# Patient Record
Sex: Male | Born: 1940 | Race: White | Hispanic: No | Smoking: Former smoker
Health system: Southern US, Community
[De-identification: ages and names within clinical notes are randomized; demographics above are authoritative.]

## PROBLEM LIST (undated history)

## (undated) DIAGNOSIS — L089 Local infection of the skin and subcutaneous tissue, unspecified: Secondary | ICD-10-CM

## (undated) DIAGNOSIS — J45909 Unspecified asthma, uncomplicated: Secondary | ICD-10-CM

## (undated) DIAGNOSIS — R0602 Shortness of breath: Secondary | ICD-10-CM

## (undated) DIAGNOSIS — I499 Cardiac arrhythmia, unspecified: Secondary | ICD-10-CM

## (undated) DIAGNOSIS — R609 Edema, unspecified: Secondary | ICD-10-CM

## (undated) DIAGNOSIS — M25562 Pain in left knee: Secondary | ICD-10-CM

## (undated) DIAGNOSIS — I6529 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: Secondary | ICD-10-CM

## (undated) DIAGNOSIS — I493 Ventricular premature depolarization: Secondary | ICD-10-CM

## (undated) DIAGNOSIS — R351 Nocturia: Secondary | ICD-10-CM

## (undated) DIAGNOSIS — K52831 Collagenous colitis: Secondary | ICD-10-CM

## (undated) DIAGNOSIS — R21 Rash and other nonspecific skin eruption: Secondary | ICD-10-CM

## (undated) DIAGNOSIS — K760 Fatty (change of) liver, not elsewhere classified: Secondary | ICD-10-CM

## (undated) DIAGNOSIS — F329 Major depressive disorder, single episode, unspecified: Secondary | ICD-10-CM

## (undated) DIAGNOSIS — N529 Male erectile dysfunction, unspecified: Secondary | ICD-10-CM

## (undated) DIAGNOSIS — R059 Cough, unspecified: Secondary | ICD-10-CM

## (undated) DIAGNOSIS — R0609 Other forms of dyspnea: Secondary | ICD-10-CM

## (undated) DIAGNOSIS — R197 Diarrhea, unspecified: Secondary | ICD-10-CM

## (undated) DIAGNOSIS — T7840XA Allergy, unspecified, initial encounter: Secondary | ICD-10-CM

## (undated) DIAGNOSIS — F419 Anxiety disorder, unspecified: Secondary | ICD-10-CM

## (undated) DIAGNOSIS — H9319 Tinnitus, unspecified ear: Secondary | ICD-10-CM

## (undated) DIAGNOSIS — R109 Unspecified abdominal pain: Secondary | ICD-10-CM

## (undated) DIAGNOSIS — M25571 Pain in right ankle and joints of right foot: Secondary | ICD-10-CM

## (undated) DIAGNOSIS — R7989 Other specified abnormal findings of blood chemistry: Secondary | ICD-10-CM

## (undated) DIAGNOSIS — G4700 Insomnia, unspecified: Secondary | ICD-10-CM

## (undated) DIAGNOSIS — B029 Zoster without complications: Secondary | ICD-10-CM

## (undated) DIAGNOSIS — H269 Unspecified cataract: Secondary | ICD-10-CM

## (undated) DIAGNOSIS — R7301 Impaired fasting glucose: Secondary | ICD-10-CM

## (undated) DIAGNOSIS — S6290XS Unspecified fracture of unspecified wrist and hand, sequela: Secondary | ICD-10-CM

## (undated) DIAGNOSIS — M7541 Impingement syndrome of right shoulder: Secondary | ICD-10-CM

## (undated) DIAGNOSIS — E669 Obesity, unspecified: Secondary | ICD-10-CM

## (undated) DIAGNOSIS — K219 Gastro-esophageal reflux disease without esophagitis: Secondary | ICD-10-CM

## (undated) DIAGNOSIS — E279 Disorder of adrenal gland, unspecified: Secondary | ICD-10-CM

## (undated) DIAGNOSIS — M1990 Unspecified osteoarthritis, unspecified site: Secondary | ICD-10-CM

## (undated) DIAGNOSIS — M109 Gout, unspecified: Secondary | ICD-10-CM

## (undated) DIAGNOSIS — F32A Depression, unspecified: Secondary | ICD-10-CM

## (undated) DIAGNOSIS — R0600 Dyspnea, unspecified: Secondary | ICD-10-CM

## (undated) DIAGNOSIS — E875 Hyperkalemia: Secondary | ICD-10-CM

## (undated) DIAGNOSIS — E7800 Pure hypercholesterolemia, unspecified: Secondary | ICD-10-CM

## (undated) DIAGNOSIS — I700 Atherosclerosis of aorta: Secondary | ICD-10-CM

## (undated) DIAGNOSIS — I10 Essential (primary) hypertension: Secondary | ICD-10-CM

## (undated) DIAGNOSIS — I739 Peripheral vascular disease, unspecified: Secondary | ICD-10-CM

## (undated) DIAGNOSIS — M8580 Other specified disorders of bone density and structure, unspecified site: Secondary | ICD-10-CM

## (undated) DIAGNOSIS — K5900 Constipation, unspecified: Secondary | ICD-10-CM

## (undated) DIAGNOSIS — M25462 Effusion, left knee: Secondary | ICD-10-CM

## (undated) HISTORY — DX: Zoster without complications: B02.9

## (undated) HISTORY — DX: Ventricular premature depolarization: I49.3

## (undated) HISTORY — DX: Edema, unspecified: R60.9

## (undated) HISTORY — PX: COLONOSCOPY: SHX174

## (undated) HISTORY — DX: Allergy, unspecified, initial encounter: T78.40XA

## (undated) HISTORY — DX: Pain in left knee: M25.562

## (undated) HISTORY — DX: Unspecified cataract: H26.9

## (undated) HISTORY — PX: CATARACT EXTRACTION: SUR2

## (undated) HISTORY — DX: Other forms of dyspnea: R06.09

## (undated) HISTORY — DX: Nocturia: R35.1

## (undated) HISTORY — DX: Collagenous colitis: K52.831

## (undated) HISTORY — DX: Shortness of breath: R06.02

## (undated) HISTORY — DX: Impaired fasting glucose: R73.01

## (undated) HISTORY — DX: Obesity, unspecified: E66.9

## (undated) HISTORY — PX: ROTATOR CUFF REPAIR: SHX139

## (undated) HISTORY — DX: Constipation, unspecified: K59.00

## (undated) HISTORY — DX: Essential (primary) hypertension: I10

## (undated) HISTORY — DX: Dyspnea, unspecified: R06.00

## (undated) HISTORY — DX: Local infection of the skin and subcutaneous tissue, unspecified: L08.9

## (undated) HISTORY — DX: Depression, unspecified: F32.A

## (undated) HISTORY — DX: Pain in right ankle and joints of right foot: M25.571

## (undated) HISTORY — DX: Unspecified asthma, uncomplicated: J45.909

## (undated) HISTORY — PX: OTHER SURGICAL HISTORY: SHX169

## (undated) HISTORY — DX: Cardiac arrhythmia, unspecified: I49.9

## (undated) HISTORY — DX: Tinnitus, unspecified ear: H93.19

## (undated) HISTORY — DX: Atherosclerosis of aorta: I70.0

## (undated) HISTORY — DX: Hyperkalemia: E87.5

## (undated) HISTORY — DX: Diarrhea, unspecified: R19.7

## (undated) HISTORY — DX: Unspecified abdominal pain: R10.9

## (undated) HISTORY — DX: 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: I65.29

## (undated) HISTORY — DX: Pure hypercholesterolemia, unspecified: E78.00

## (undated) HISTORY — DX: Major depressive disorder, single episode, unspecified: F32.9

## (undated) HISTORY — DX: Male erectile dysfunction, unspecified: N52.9

## (undated) HISTORY — DX: Effusion, left knee: M25.462

## (undated) HISTORY — DX: Fatty (change of) liver, not elsewhere classified: K76.0

## (undated) HISTORY — DX: Unspecified fracture of unspecified wrist and hand, sequela: S62.90XS

## (undated) HISTORY — DX: Anxiety disorder, unspecified: F41.9

## (undated) HISTORY — DX: Insomnia, unspecified: G47.00

## (undated) HISTORY — DX: Other specified abnormal findings of blood chemistry: R79.89

## (undated) HISTORY — DX: Disorder of adrenal gland, unspecified: E27.9

## (undated) HISTORY — PX: NASAL SINUS SURGERY: SHX719

## (undated) HISTORY — PX: LUMBAR LAMINECTOMY: SHX95

## (undated) HISTORY — DX: Gastro-esophageal reflux disease without esophagitis: K21.9

## (undated) HISTORY — DX: Peripheral vascular disease, unspecified: I73.9

## (undated) HISTORY — DX: Other specified disorders of bone density and structure, unspecified site: M85.80

## (undated) HISTORY — DX: Cough, unspecified: R05.9

## (undated) HISTORY — DX: Impingement syndrome of right shoulder: M75.41

## (undated) HISTORY — DX: Unspecified osteoarthritis, unspecified site: M19.90

## (undated) HISTORY — DX: Rash and other nonspecific skin eruption: R21

## (undated) HISTORY — DX: Gout, unspecified: M10.9

---

## 1971-08-28 HISTORY — PX: BACK SURGERY: SHX140

## 1998-02-13 ENCOUNTER — Observation Stay (HOSPITAL_COMMUNITY): Admission: EM | Admit: 1998-02-13 | Discharge: 1998-02-14 | Payer: Self-pay | Admitting: *Deleted

## 2001-08-25 ENCOUNTER — Encounter: Payer: Self-pay | Admitting: Orthopedic Surgery

## 2001-08-25 ENCOUNTER — Ambulatory Visit (HOSPITAL_COMMUNITY): Admission: RE | Admit: 2001-08-25 | Discharge: 2001-08-25 | Payer: Self-pay | Admitting: Orthopedic Surgery

## 2002-09-01 ENCOUNTER — Encounter: Payer: Self-pay | Admitting: Orthopedic Surgery

## 2002-09-08 ENCOUNTER — Observation Stay (HOSPITAL_COMMUNITY): Admission: RE | Admit: 2002-09-08 | Discharge: 2002-09-09 | Payer: Self-pay | Admitting: Orthopedic Surgery

## 2002-12-15 ENCOUNTER — Encounter: Payer: Self-pay | Admitting: Orthopedic Surgery

## 2002-12-15 ENCOUNTER — Encounter: Admission: RE | Admit: 2002-12-15 | Discharge: 2002-12-15 | Payer: Self-pay | Admitting: Orthopedic Surgery

## 2005-06-29 ENCOUNTER — Ambulatory Visit: Payer: Self-pay | Admitting: Gastroenterology

## 2005-07-06 ENCOUNTER — Encounter (INDEPENDENT_AMBULATORY_CARE_PROVIDER_SITE_OTHER): Payer: Self-pay | Admitting: *Deleted

## 2005-07-06 ENCOUNTER — Ambulatory Visit: Payer: Self-pay | Admitting: Gastroenterology

## 2010-05-11 ENCOUNTER — Encounter (INDEPENDENT_AMBULATORY_CARE_PROVIDER_SITE_OTHER): Payer: Self-pay | Admitting: *Deleted

## 2010-07-11 ENCOUNTER — Encounter (INDEPENDENT_AMBULATORY_CARE_PROVIDER_SITE_OTHER): Payer: Self-pay | Admitting: *Deleted

## 2010-07-25 ENCOUNTER — Encounter (INDEPENDENT_AMBULATORY_CARE_PROVIDER_SITE_OTHER): Payer: Self-pay | Admitting: *Deleted

## 2010-07-26 ENCOUNTER — Ambulatory Visit: Payer: Self-pay | Admitting: Gastroenterology

## 2010-08-09 ENCOUNTER — Ambulatory Visit: Payer: Self-pay | Admitting: Gastroenterology

## 2010-08-10 ENCOUNTER — Encounter: Payer: Self-pay | Admitting: Gastroenterology

## 2010-09-26 NOTE — Letter (Signed)
Summary: Colonoscopy Letter  [facility]  [address] [place], [place] [address]   Phone: [phone]  Fax: [phone]      May 11, 2010 MRN: [id number]   Lee Compton [address] [place], [place]  [address]   Dear [name],   According to your medical record, it is time for you to schedule a Colonoscopy. The American Cancer Society recommends this procedure as a method to detect early colon cancer. Patients with a family history of colon cancer, or a personal history of colon polyps or inflammatory bowel disease are at increased risk.  This letter has beeen generated based on the recommendations made at the time of your procedure. If you feel that in your particular situation this may no longer apply, please contact our office.  Please call our office at [phone] to schedule this appointment or to update your records at your earliest convenience.  Thank you for cooperating with [place] to provide you with the very best care possible.   Sincerely,  Judie Petit T. Russella Dar, M.D.  [facility] Gastroenterology Division [phone]

## 2010-09-26 NOTE — Miscellaneous (Signed)
Summary: [facility] PV  Clinical Lists Changes  Medications: Added new medication of MOVIPREP 100 GM  SOLR (PEG-KCL-NACL-NASULF-NA ASC-C) As per prep instructions. - Signed Rx of MOVIPREP 100 GM  SOLR (PEG-KCL-NACL-NASULF-NA ASC-C) As per prep instructions.;  #1 x 0;  Signed;  Entered by: Ezra Sites RN;  Authorized by: Meryl Dare MD [facility];  Method used: Electronically to [facility]  [phone]*, [address], [place], [place], [place]  [address], Ph: [phone] or [phone], Fax: [phone] Observations: Added new observation of NKA: T (07/26/2010 10:07)    Prescriptions: MOVIPREP 100 GM  SOLR (PEG-KCL-NACL-NASULF-NA ASC-C) As per prep instructions.  #1 x 0   Entered by:   Ezra Sites RN   Authorized by:   Meryl Dare MD [facility]   Signed by:   Ezra Sites RN on 07/26/2010   Method used:   Electronically to        [facility]  [id number]* (retail)       [address]       [place], [place]  [address]       Ph: [phone] or [phone]       Fax: [phone]   RxID:   [id number]

## 2010-09-26 NOTE — Letter (Signed)
Summary: [facility] Instructions  [facility]  [address] [place], [place] [address]   Phone: [phone]  Fax: [phone]       Lee Compton    Nov 07, 1940    MRN: [id number]        Procedure Day Dorna Bloom:  Wednesday 08/09/2010     Arrival Time:  10:00 am      Procedure Time:  11:00 am     Location of Procedure:                    _x _  [facility] (4th Floor)                        PREPARATION FOR COLONOSCOPY WITH MOVIPREP   Starting 5 days prior to your procedure Friday 12/9 do not eat nuts, seeds, popcorn, corn, beans, peas,  salads, or any raw vegetables.  Do not take any fiber supplements (e.g. Metamucil, Citrucel, and Benefiber).  THE DAY BEFORE YOUR PROCEDURE         DATE: Tuesday 12/13  1.  Drink clear liquids the entire day-NO SOLID FOOD  2.  Do not drink anything colored red or purple.  Avoid juices with pulp.  No orange juice.  3.  Drink at least 64 oz. (8 glasses) of fluid/clear liquids during the day to prevent dehydration and help the prep work efficiently.  CLEAR LIQUIDS INCLUDE: Water Jello Ice Popsicles Tea (sugar ok, no milk/cream) Powdered fruit flavored drinks Coffee (sugar ok, no milk/cream) Gatorade Juice: apple, white grape, white cranberry  Lemonade Clear bullion, consomm, broth Carbonated beverages (any kind) Strained chicken noodle soup Hard Candy                             4.  In the morning, mix first dose of MoviPrep solution:    Empty 1 Pouch A and 1 Pouch B into the disposable container    Add lukewarm drinking water to the top line of the container. Mix to dissolve    Refrigerate (mixed solution should be used within 24 hrs)  5.  Begin drinking the prep at 5:00 p.m. The MoviPrep container is divided by 4 marks.   Every 15 minutes drink the solution down to the next mark (approximately 8 oz) until the full liter is complete.   6.  Follow completed prep with 16 oz of clear liquid of your choice  (Nothing red or purple).  Continue to drink clear liquids until bedtime.  7.  Before going to bed, mix second dose of MoviPrep solution:    Empty 1 Pouch A and 1 Pouch B into the disposable container    Add lukewarm drinking water to the top line of the container. Mix to dissolve    Refrigerate  THE DAY OF YOUR PROCEDURE      DATE: Wednesday 12/14  Beginning at 6:00 a.m. (5 hours before procedure):         1. Every 15 minutes, drink the solution down to the next mark (approx 8 oz) until the full liter is complete.  2. Follow completed prep with 16 oz. of clear liquid of your choice.    3. You may drink clear liquids until 9:00 am (2 HOURS BEFORE PROCEDURE).   MEDICATION INSTRUCTIONS  Unless otherwise instructed, you should take regular prescription medications with a small sip of water   as early as possible the  morning of your procedure.  Additional medication instructions: Do not take Benazepril/HCTZ day of procedure.         OTHER INSTRUCTIONS  You will need a responsible adult at least 68 years of age to accompany you and drive you home.   This person must remain in the waiting room during your procedure.  Wear loose fitting clothing that is easily removed.  Leave jewelry and other valuables at home.  However, you may wish to bring a book to read or  an iPod/MP3 player to listen to music as you wait for your procedure to start.  Remove all body piercing jewelry and leave at home.  Total time from sign-in until discharge is approximately 2-3 hours.  You should go home directly after your procedure and rest.  You can resume normal activities the  day after your procedure.  The day of your procedure you should not:   Drive   Make legal decisions   Operate machinery   Drink alcohol   Return to work  You will receive specific instructions about eating, activities and medications before you leave.    The above instructions have been reviewed and  explained to me by   Ezra Sites RN  July 26, 2010 10:25 AM     I fully understand and can verbalize these instructions _____________________________ Date _________

## 2010-09-26 NOTE — Letter (Signed)
Summary: Pre Visit Letter Revised  [facility]  [address] [place], [place] [address]   Phone: [phone]  Fax: [phone]        07/11/2010 MRN: [id number] Lee Compton [address] [place], [place]  [address]             Procedure Date:  08-09-10   Welcome to the Gastroenterology Division at [facility].    You are scheduled to see a nurse for your pre-procedure visit on 07-26-10 at 10:00a.m. on the 3rd floor at [facility], [address]  We ask that you try to arrive at our office 15 minutes prior to your appointment time to allow for check-in.  Please take a minute to review the attached form.  If you answer "Yes" to one or more of the questions on the first page, we ask that you call the person listed at your earliest opportunity.  If you answer "No" to all of the questions, please complete the rest of the form and bring it to your appointment.    Your nurse visit will consist of discussing your medical and surgical history, your immediate family medical history, and your medications.   If you are unable to list all of your medications on the form, please bring the medication bottles to your appointment and we will list them.  We will need to be aware of both prescribed and over the counter drugs.  We will need to know exact dosage information as well.    Please be prepared to read and sign documents such as consent forms, a financial agreement, and acknowledgement forms.  If necessary, and with your consent, a friend or relative is welcome to sit-in on the nurse visit with you.  Please bring your insurance card so that we may make a copy of it.  If your insurance requires a referral to see a specialist, please bring your referral form from your primary care physician.  No co-pay is required for this nurse visit.     If you cannot keep your appointment, please call [phone] to cancel or reschedule prior to your appointment date.  This allows [place]  the opportunity to schedule an appointment for another patient in need of care.    Thank you for choosing [facility] for your medical needs.  We appreciate the opportunity to care for you.  Please visit [place] at our website  to learn more about our practice.  Sincerely, The Gastroenterology Division

## 2010-09-28 NOTE — Letter (Signed)
Summary: Patient Notice- Polyp Results  [facility]  [address] [place], [place] [address]   Phone: [phone]  Fax: [phone]        August 10, 2010 MRN: [id number]    JURRELL ROYSTER [address] [place], [place]  [address]    Dear Mr. LOFASO,  I am pleased to inform you that the colon polyp(s) removed during your recent colonoscopy was (were) found to be benign (no cancer detected) upon pathologic examination.  I recommend you have a repeat colonoscopy examination in 3 years to look for recurrent polyps, as having colon polyps increases your risk for having recurrent polyps or even colon cancer in the future.  Should you develop new or worsening symptoms of abdominal pain, bowel habit changes or bleeding from the rectum or bowels, please schedule an evaluation with either your primary care physician or with me.  Continue treatment plan as outlined the day of your exam.  Please call us if you are having persistent problems or have questions about your condition that have not been fully answered at this time.  Sincerely,  Meryl Dare MD [facility]  This letter has been electronically signed by your physician.  Appended Document: Patient Notice- Polyp Results Letter Mailed

## 2010-09-28 NOTE — Procedures (Signed)
Summary: Colonoscopy  Patient: Lee Compton Note: All result statuses are Final unless otherwise noted.  Tests: (1) Colonoscopy (COL)   COL Colonoscopy           DONE     [facility]     [address]     [place], [place]  [address]           COLONOSCOPY PROCEDURE REPORT           PATIENT:  Lee Compton, Lee Compton  MR#:  [id number]     BIRTHDATE:  [birth year], 68 yrs. old  GENDER:  male     ENDOSCOPIST:  Judie Petit T. Russella Dar, MD, [facility]           PROCEDURE DATE:  08/09/2010     PROCEDURE:  Colonoscopy with biopsy and snare polypectomy     ASA CLASS:  Class II     INDICATIONS:  1) surveillance and high-risk screening  2) history     of adenomatous colon polyps: 06/2005.     MEDICATIONS:   Fentanyl 100 mcg IV, Versed 10 mg IV     DESCRIPTION OF PROCEDURE:   After the risks benefits and     alternatives of the procedure were thoroughly explained, informed     consent was obtained.  Digital rectal exam was performed and     revealed no abnormalities.   The LB PCF-H180AL [id number] endoscope     was introduced through the anus and advanced to the cecum, which     was identified by both the appendix and ileocecal valve, without     limitations.  The quality of the prep was excellent, using     MoviPrep.  The instrument was then slowly withdrawn as the colon     was fully examined.     <<PROCEDUREIMAGES>>     FINDINGS:  A sessile polyp was found in the cecum. It was 4 mm in     size. The polyp was removed using cold biopsy forceps.  A sessile     polyp was found in the ascending colon. It was 6 mm in size. Polyp     was snared without cautery. Retrieval was successful. Three polyps     were found in the transverse colon. They were 4 - 6 mm in size.     Polyps were snared without cautery. Retrieval was successful. A     sessile polyp was found in the rectum. It was 7 mm in size. Polyp     was snared without cautery. Retrieval was successful. Mild     diverticulosis was found in the sigmoid colon.   This was otherwise     a normal examination of the colon.  Retroflexed views in the     rectum revealed internal hemorrhoids, small.  The time to cecum =     3.5  minutes. The scope was then withdrawn (time =  12.25  min)     from the patient and the procedure completed.           COMPLICATIONS:  None           ENDOSCOPIC IMPRESSION:     1) 4 mm sessile polyp in the cecum     2) 6 mm sessile polyp in the ascending colon     3) 4 - 6 mm, three polyps in the transverse colon     4) 7 mm sessile polyp in the rectum     5) Mild diverticulosis in the sigmoid  colon     6) Internal hemorrhoids           RECOMMENDATIONS:     1) High fiber diet with liberal fluid intake.     2) Await pathology results     3) Repeat Colonoscopy in 3 years pending pathology review.           Venita Lick. Russella Dar, MD, [facility]           CC: Creola Corn, MD           n.     Rosalie DoctorVenita Lick. Stark at 08/09/2010 10:47 AM           Ollen Bowl, [id number]  Note: An exclamation mark (!) indicates a result that was not dispersed into the flowsheet. Document Creation Date: 08/09/2010 10:48 AM _______________________________________________________________________  (1) Order result status: Final Collection or observation date-time: 08/09/2010 10:40 Requested date-time:  Receipt date-time:  Reported date-time:  Referring Physician:   Ordering Physician: Claudette Head [phone]) Specimen Source:  Source: Launa Grill Order Number: [id number] Lab site:   Appended Document: Colonoscopy     Procedures Next Due Date:    Colonoscopy: 07/2013

## 2011-01-12 NOTE — Op Note (Signed)
NAME:  BRYNDEN, THUNE                            ACCOUNT NO.:  [id number]   MEDICAL RECORD NO.:  [id number]                   PATIENT TYPE:  AMB   LOCATION:  DFTL                                 FACILITY:  [facility]   PHYSICIAN:  Marlowe Kays, M.D.               DATE OF BIRTH:  05-04-1941   DATE OF PROCEDURE:  09/08/2002  DATE OF DISCHARGE:                                 OPERATIVE REPORT   PREOPERATIVE DIAGNOSES:  1. Torn rotator cuff.  2. Possible residual labral disruption and/or loose body, glenohumeral joint     right shoulder.   POSTOPERATIVE DIAGNOSES:  1. Extensive tear, anterior rotator cuff.  2. Labral disruption, glenohumeral joint, right shoulder.   PROCEDURES:  1. Right shoulder arthroscopy with debridement of labrum.  2. Anterior acromionectomy and repair of extensive anterior rotator cuff     tear.   SURGEON:  Marlowe Kays, M.D.   ASSISTANTDruscilla Brownie. Cherlynn June.   ANESTHESIA:  General.   PATHOLOGY AND JUSTIFICATION FOR PROCEDURE:  The patient injured his shoulder  a year ago with the initial MRI showing extensive tear of the anterior  rotator cuff, a fracture of the anterior glenoid, and possible loose bodies  in the joint.  He declined to have surgery at that time but because of  persistent symptoms, it was elected to go ahead and have surgery.  Because  of the time elapsed, I elected to obtain another MRI in December of this  year, which showed the extensive tear of the rotator cuff involving the  supraspinatus and portion of the subscapularis, with no definite disruption  of the glenohumeral joint.  Today I plan to arthroscope his right shoulder  to be sure that there was not any residual problem there that needed  correction and then perform the anterior acromionectomy and the repair of  the rotator cuff.   DESCRIPTION OF PROCEDURE:  Prophylactic antibiotics, satisfactory general  anesthesia, beach chair position on the Schlein frame.  The right  shoulder  girdle was prepped with Duraprep, draped in a sterile field.  I marked out  the anatomy of the shoulder joint first and through a posterior soft spot  easily entered the glenohumeral joint with a blunt trocar.  On inspection of  the joint, his humeral head looked fine.  The long head of the biceps tendon  also looked normal.  It did have a significant amount of fraying of both the  anterior and posterior labrum with some irregularity of the glenoid, which  is presumably the site of the previous fracture.  There did not appear to be  any labral detachment or any loose bodies in the joint.  I advanced the  scope between the biceps and subscapularis and used a switching stick and  made the anterior incision, followed by the metal cannula, 4.2 shaver, and  then debrided down the labrum.  There was  some reactive bleeding, presumably  because of the previous trauma.  I was able to get a satisfactory post-  debridement picture, however.  We then evacuated all fluid possible from the  joint, and after infiltrating the two portals with 0.5% Marcaine with  adrenalin, I closed them with staples and then applied an Ioban and  proceeded with the open rotator cuff repair.  I used a shoulder strap  incision because of the location of the tear with anterior and posterior  flaps.  I then used the cutting cautery to open the fascia in line with the  clavicle and mid-acromion, reflecting the tissue back anteriorly and  posteriorly.  The [facility] joint was identified and protected.  Protecting the  underlying rotator cuff with a Cobb elevator, I made my initial anterior  acromionectomy and I then performed additional bone resection to completely  decompress the rotator cuff.  The tear was then noted, being about 2.5 cm  far anterior.  I was able to get to it with external rotation of the arm and  retraction inferiorly.  There was some retraction of the inferior rotator  cuff fibers, which I retrieved with  a small hemostat.  After identifying the  pathology, I then roughened up the lateral humerus with small rongeur and  used two Super Mitek rotator cuff anchors with a total of four suture arms  to tightly reattach the rotator cuff.  I then supplemented this with  smoothing down of the distal rotator cuff to the lateral tissue of the  humerus with the same Ethibond suture.  This gave a nice, stable repair with  no residual impingement.  The wound was then irrigated well with sterile  saline and soft tissues infiltrated with 0.5% Marcaine with adrenalin.  The  fascia over the anterior acromion was reapproximated with interrupted #1  Vicryl, as was the deltoid muscle in two layers with the same.  The  subcutaneous tissue was closed with 2-0 Vicryl and the skin with staples.  Betadine and Adaptic dry sterile dressing,  shoulder immobilizer were  applied.  He tolerated the procedure well and was taken to the recovery room  in satisfactory condition with no known complications.                                                Marlowe Kays, M.D.    JA/MEDQ  D:  09/08/2002  T:  09/08/2002  Job:  [id number]

## 2011-01-12 NOTE — H&P (Signed)
NAME:  Lee Compton, Lee Compton                            ACCOUNT NO.:  [id number]   MEDICAL RECORD NO.:  [id number]                   PATIENT TYPE:  INP   LOCATION:  NA                                   FACILITY:  [facility]   PHYSICIAN:  Marlowe Kays, M.D.               DATE OF BIRTH:  04/12/1941   DATE OF ADMISSION:  09/08/2002  DATE OF DISCHARGE:                                HISTORY & PHYSICAL   CHIEF COMPLAINT:  Pain in my right shoulder.   HISTORY OF PRESENT ILLNESS:  This 70 year old white male was seen by Marlowe Kays, M.D. for continuing progressive problems concerning his right  shoulder.  Approximately a year ago he injured his right shoulder when he  fell on his hand and jammed his shoulder back into the joint.  He was seen  by Teena Irani. Arlyce Dice, M.D. early on, given an immobilizer and analgesics.  Since that time he has improved with his right shoulder over the past year  or so.  He is a very stoic gentleman and he has had to tolerate the pain and  discomfort as well as pain with activity.  He has now developed limitation  of range of motion.  In the morning he has a considerable amount of pain and  discomfort.  We eventually ordered an MRI and a complex intra-articular  fracture of the anterior aspect of the glenoid was noted and he also has an  associated tear of the anterior labrum.  Intra-articular loose bodies also  noted.  The MRI further pointed out a full thickness acute tear of the  anterior leading edge of the supraspinatus tendon of the rotator cuff.  For  this reason we have decided to go ahead with arthroscopic inspection of the  shoulder with debridement of the labrum and repair of rotator cuff.   PAST MEDICAL HISTORY:  Teena Irani. Arlyce Dice, M.D. is his primary care physician.  He has been treating him over the years for asthma and currently he is on  Advair 250/50.  He has no drug allergies.  He currently is taking a cold  medicine for a mild head cold which contains  pseudoepinephrine and  dextromethorphan.   PAST SURGICAL HISTORY:  In February of 1973 lumbar laminectomy.  He had  sinus surgeries in 1998.   SOCIAL HISTORY:  The patient is widowed.  He is a Data processing manager at  [employer].  Has no tobacco products and has two to three beers a day.  He has two children which will assist him after his regular hospitalization.   FAMILY HISTORY:  Positive for diabetes in his father as well as previous  stroke.   REVIEW OF SYSTEMS:  CNS:  No seizures or paralysis, numbness, double vision.  RESPIRATORY:  No productive cough.  No hemoptysis.  No shortness of breath.  CARDIOVASCULAR:  No chest pain.  No angina.  No orthopnea.  GASTROINTESTINAL:  No nausea, vomiting, melena, or bloody stool.  GENITOURINARY:  No discharge, dysuria, hematuria.  MUSCULOSKELETAL:  Primarily in present illness.   PHYSICAL EXAMINATION:  GENERAL:  Alert, cooperative, friendly 70 year old  white male.  VITAL SIGNS:  Blood pressure 140/90, pulse 88, respirations 12.  HEENT:  Normocephalic.  PERRLA.  Oropharynx is clear with an upper dental  prosthesis.  NECK:  Supple.  No lymphadenopathy.  CHEST:  Clear to auscultation.  No rhonchi.  No rales.  HEART:  Regular rate and rhythm.  No murmurs are heard.  ABDOMEN:  Soft, nontender.  Liver, spleen not felt.  GENITALIA:  Not done.  Not pertinent to present illness.  RECTAL:  Not done.  Not pertinent to present illness.  EXTREMITIES:  The patient has painful range of motion of the right shoulder,  particularly on abduction and external rotation.  Tenderness over the  rotator cuff laterally.   ADMITTING DIAGNOSES:  1. Tear of the rotator cuff of the right shoulder, possible labral     disruption.  2. Asthma.   PLAN:  The patient will undergo arthroscopic evaluation and surgery to the  right shoulder with an anterior acromionectomy and repair of the rotator  cuff and debridement.     Dooley L. Shela Nevin, P.A.              Marlowe Kays, M.D.    DLU/MEDQ  D:  09/01/2002  T:  09/01/2002  Job:  [id number]   cc:   Teena Irani. Arlyce Dice, M.D.  [address]  [place]  [place] [address]  Fax: [phone]

## 2013-05-29 ENCOUNTER — Encounter: Payer: Self-pay | Admitting: Gastroenterology

## 2013-08-10 ENCOUNTER — Encounter: Payer: Self-pay | Admitting: Gastroenterology

## 2013-09-25 ENCOUNTER — Ambulatory Visit (AMBULATORY_SURGERY_CENTER): Payer: Self-pay

## 2013-09-25 VITALS — Ht 70.0 in | Wt 210.0 lb

## 2013-09-25 DIAGNOSIS — Z860100 Personal history of colon polyps, unspecified: Secondary | ICD-10-CM

## 2013-09-25 MED ORDER — MOVIPREP 100 G PO SOLR: 1.0000 | Freq: Once | ORAL | Status: DC

## 2013-09-29 ENCOUNTER — Encounter: Payer: Self-pay | Admitting: Gastroenterology

## 2013-10-09 ENCOUNTER — Encounter: Payer: Self-pay | Admitting: Gastroenterology

## 2013-10-09 ENCOUNTER — Ambulatory Visit (AMBULATORY_SURGERY_CENTER): Payer: Medicare Other | Admitting: Gastroenterology

## 2013-10-09 VITALS — BP 123/78 | HR 67 | Temp 98.4°F | Resp 18 | Ht 70.0 in | Wt 210.0 lb

## 2013-10-09 DIAGNOSIS — D126 Benign neoplasm of colon, unspecified: Secondary | ICD-10-CM

## 2013-10-09 DIAGNOSIS — Z860100 Personal history of colon polyps, unspecified: Secondary | ICD-10-CM

## 2013-10-09 MED ORDER — SODIUM CHLORIDE 0.9 % IV SOLN: 500.0000 mL | INTRAVENOUS | Status: DC

## 2013-10-09 NOTE — Patient Instructions (Signed)
YOU HAD AN ENDOSCOPIC PROCEDURE TODAY AT THE [facility]: Refer to the procedure report that was given to you for any specific questions about what was found during the examination.  If the procedure report does not answer your questions, please call your gastroenterologist to clarify.  If you requested that your care partner not be given the details of your procedure findings, then the procedure report has been included in a sealed envelope for you to review at your convenience later.  YOU SHOULD EXPECT: Some feelings of bloating in the abdomen. Passage of more gas than usual.  Walking can help get rid of the air that was put into your GI tract during the procedure and reduce the bloating. If you had a lower endoscopy (such as a colonoscopy or flexible sigmoidoscopy) you may notice spotting of blood in your stool or on the toilet paper. If you underwent a bowel prep for your procedure, then you may not have a normal bowel movement for a few days.  DIET: Your first meal following the procedure should be a light meal and then it is ok to progress to your normal diet.  A half-sandwich or bowl of soup is an example of a good first meal.  Heavy or fried foods are harder to digest and may make you feel nauseous or bloated.  Likewise meals heavy in dairy and vegetables can cause extra gas to form and this can also increase the bloating.  Drink plenty of fluids but you should avoid alcoholic beverages for 24 hours.  ACTIVITY: Your care partner should take you home directly after the procedure.  You should plan to take it easy, moving slowly for the rest of the day.  You can resume normal activity the day after the procedure however you should NOT DRIVE or use heavy machinery for 24 hours (because of the sedation medicines used during the test).    SYMPTOMS TO REPORT IMMEDIATELY: A gastroenterologist can be reached at any hour.  During normal business hours, 8:30 AM to 5:00 PM Monday through Friday,  call [phone].  After hours and on weekends, please call the GI answering service at [phone] who will take a message and have the physician on call contact you.   Following lower endoscopy (colonoscopy or flexible sigmoidoscopy):  Excessive amounts of blood in the stool  Significant tenderness or worsening of abdominal pains  Swelling of the abdomen that is new, acute  Fever of 100F or higher  FOLLOW UP: If any biopsies were taken you will be contacted by phone or by letter within the next 1-3 weeks.  Call your gastroenterologist if you have not heard about the biopsies in 3 weeks.  Our staff will call the home number listed on your records the next business day following your procedure to check on you and address any questions or concerns that you may have at that time regarding the information given to you following your procedure. This is a courtesy call and so if there is no answer at the home number and we have not heard from you through the emergency physician on call, we will assume that you have returned to your regular daily activities without incident.  SIGNATURES/CONFIDENTIALITY: You and/or your care partner have signed paperwork which will be entered into your electronic medical record.  These signatures attest to the fact that that the information above on your After Visit Summary has been reviewed and is understood.  Full responsibility of the confidentiality of this   discharge information lies with you and/or your care-partner.  Hemorrhoids, polyps-handouts given  Repeat colonoscopy in 5 years.

## 2013-10-09 NOTE — Progress Notes (Signed)
Called to room to assist during endoscopic procedure.  Patient ID and intended procedure confirmed with present staff. Received instructions for my participation in the procedure from the performing physician.  

## 2013-10-09 NOTE — Op Note (Signed)
[facility]  [address] [place], [id number]   COLONOSCOPY PROCEDURE REPORT  PATIENT: Lee Compton, Lee Compton  MR#: [id number] BIRTHDATE: June 01, 1941 , 72  yrs. old GENDER: Male ENDOSCOPIST: Ladene Artist, MD, [facility] PROCEDURE DATE:  10/09/2013 PROCEDURE:   Colonoscopy with snare polypectomy First Screening Colonoscopy - Avg.  risk and is 50 yrs.  old or older - No.  Prior Negative Screening - Now for repeat screening. N/A  History of Adenoma - Now for follow-up colonoscopy & has been > or = to 3 yrs.  Yes hx of adenoma.  Has been 3 or more years since last colonoscopy.  Polyps Removed Today? Yes. ASA CLASS:   Class II INDICATIONS:Patient's personal history of adenomatous colon polyps.  MEDICATIONS: MAC sedation, administered by CRNA and propofol (Diprivan) 250mg  IV DESCRIPTION OF PROCEDURE:   After the risks benefits and alternatives of the procedure were thoroughly explained, informed consent was obtained.  A digital rectal exam revealed no abnormalities of the rectum.   The LB [id number]  endoscope was introduced through the anus and advanced to the cecum, which was identified by both the appendix and ileocecal valve. No adverse events experienced.   The quality of the prep was good, using MoviPrep  The instrument was then slowly withdrawn as the colon was fully examined.  COLON FINDINGS: A sessile polyp measuring 6 mm in size was found in the transverse colon.  A polypectomy was performed with a cold snare.  The resection was complete and the polyp tissue was completely retrieved.   The colon was otherwise normal.  There was no diverticulosis, inflammation, polyps or cancers unless previously stated.  Retroflexed views revealed internal hemorrhoids. The time to cecum=3 minutes 37 seconds.  Withdrawal time=11 minutes 10 seconds.  The scope was withdrawn and the procedure completed. COMPLICATIONS: There were no complications.  ENDOSCOPIC IMPRESSION: 1.    Sessile polyp measuring 6 mm in the transverse colon; polypectomy performed with a cold snare 2.   Small internal hemorrhoids  RECOMMENDATIONS: 1.  Await pathology results 2.  Repeat Colonoscopy in 5 years.  eSigned:  Ladene Artist, MD, [facility] 10/09/2013 9:15 AM   cc: Shon Baton, MD

## 2013-10-12 ENCOUNTER — Telehealth: Payer: Self-pay | Admitting: *Deleted

## 2013-10-12 NOTE — Telephone Encounter (Signed)
  Follow up Call-  Call back number 10/09/2013  Post procedure Call Back phone  # [phone] cell  Permission to leave phone message No     Patient questions:  Do you have a fever, pain , or abdominal swelling? no Pain Score  0 *  Have you tolerated food without any problems? yes  Have you been able to return to your normal activities? yes  Do you have any questions about your discharge instructions: Diet   no Medications  no Follow up visit  no  Do you have questions or concerns about your Care? no  Actions: * If pain score is 4 or above: No action needed, pain <4.

## 2013-10-15 ENCOUNTER — Encounter: Payer: Self-pay | Admitting: Gastroenterology

## 2014-01-15 ENCOUNTER — Ambulatory Visit (HOSPITAL_COMMUNITY)
Admission: RE | Admit: 2014-01-15 | Discharge: 2014-01-15 | Disposition: A | Discharge status: Home / Self Care | Payer: Medicare Other | Source: Ambulatory Visit | Attending: Vascular Surgery | Admitting: Vascular Surgery

## 2014-01-15 ENCOUNTER — Other Ambulatory Visit (HOSPITAL_COMMUNITY): Payer: Self-pay | Admitting: Internal Medicine

## 2014-01-15 DIAGNOSIS — R52 Pain, unspecified: Secondary | ICD-10-CM

## 2014-01-15 DIAGNOSIS — R609 Edema, unspecified: Secondary | ICD-10-CM | POA: Insufficient documentation

## 2014-01-15 DIAGNOSIS — M79609 Pain in unspecified limb: Secondary | ICD-10-CM | POA: Insufficient documentation

## 2014-05-28 ENCOUNTER — Encounter: Payer: Self-pay | Admitting: Gastroenterology

## 2015-11-22 ENCOUNTER — Ambulatory Visit (INDEPENDENT_AMBULATORY_CARE_PROVIDER_SITE_OTHER): Payer: 59 | Admitting: Licensed Clinical Social Worker

## 2015-11-22 DIAGNOSIS — F331 Major depressive disorder, recurrent, moderate: Secondary | ICD-10-CM | POA: Diagnosis not present

## 2015-11-30 ENCOUNTER — Ambulatory Visit (INDEPENDENT_AMBULATORY_CARE_PROVIDER_SITE_OTHER): Payer: 59 | Admitting: Licensed Clinical Social Worker

## 2015-11-30 DIAGNOSIS — F331 Major depressive disorder, recurrent, moderate: Secondary | ICD-10-CM | POA: Diagnosis not present

## 2015-12-06 ENCOUNTER — Ambulatory Visit: Payer: 59 | Admitting: Licensed Clinical Social Worker

## 2015-12-13 ENCOUNTER — Ambulatory Visit (INDEPENDENT_AMBULATORY_CARE_PROVIDER_SITE_OTHER): Payer: 59 | Admitting: Licensed Clinical Social Worker

## 2015-12-13 DIAGNOSIS — F3341 Major depressive disorder, recurrent, in partial remission: Secondary | ICD-10-CM

## 2016-01-04 ENCOUNTER — Ambulatory Visit (INDEPENDENT_AMBULATORY_CARE_PROVIDER_SITE_OTHER): Payer: 59 | Admitting: Licensed Clinical Social Worker

## 2016-01-04 DIAGNOSIS — F3341 Major depressive disorder, recurrent, in partial remission: Secondary | ICD-10-CM

## 2016-01-16 ENCOUNTER — Other Ambulatory Visit: Payer: Self-pay | Admitting: Ophthalmology

## 2016-01-25 ENCOUNTER — Ambulatory Visit (INDEPENDENT_AMBULATORY_CARE_PROVIDER_SITE_OTHER): Payer: 59 | Admitting: Licensed Clinical Social Worker

## 2016-01-25 DIAGNOSIS — F3341 Major depressive disorder, recurrent, in partial remission: Secondary | ICD-10-CM | POA: Diagnosis not present

## 2016-02-21 ENCOUNTER — Ambulatory Visit (INDEPENDENT_AMBULATORY_CARE_PROVIDER_SITE_OTHER): Payer: 59 | Admitting: Licensed Clinical Social Worker

## 2016-02-21 DIAGNOSIS — F3341 Major depressive disorder, recurrent, in partial remission: Secondary | ICD-10-CM

## 2016-03-19 ENCOUNTER — Ambulatory Visit (INDEPENDENT_AMBULATORY_CARE_PROVIDER_SITE_OTHER): Payer: 59 | Admitting: Licensed Clinical Social Worker

## 2016-03-19 DIAGNOSIS — F3341 Major depressive disorder, recurrent, in partial remission: Secondary | ICD-10-CM | POA: Diagnosis not present

## 2017-04-27 ENCOUNTER — Emergency Department (HOSPITAL_COMMUNITY): Payer: Medicare Other

## 2017-04-27 ENCOUNTER — Inpatient Hospital Stay (HOSPITAL_COMMUNITY)
Admission: EM | Admit: 2017-04-27 | Discharge: 2017-04-29 | DRG: 465 | Disposition: A | Discharge status: Home Health | Payer: Medicare Other | Attending: Orthopedic Surgery | Admitting: Orthopedic Surgery

## 2017-04-27 ENCOUNTER — Emergency Department (HOSPITAL_COMMUNITY): Payer: Medicare Other | Admitting: Anesthesiology

## 2017-04-27 ENCOUNTER — Encounter (HOSPITAL_COMMUNITY)
Admission: EM | Disposition: A | Discharge status: Home Health | Payer: Self-pay | Source: Home / Self Care | Attending: Orthopedic Surgery

## 2017-04-27 ENCOUNTER — Encounter (HOSPITAL_COMMUNITY): Payer: Self-pay | Admitting: Emergency Medicine

## 2017-04-27 DIAGNOSIS — Z87891 Personal history of nicotine dependence: Secondary | ICD-10-CM

## 2017-04-27 DIAGNOSIS — S52502B Unspecified fracture of the lower end of left radius, initial encounter for open fracture type I or II: Secondary | ICD-10-CM

## 2017-04-27 DIAGNOSIS — S52602B Unspecified fracture of lower end of left ulna, initial encounter for open fracture type I or II: Secondary | ICD-10-CM

## 2017-04-27 DIAGNOSIS — T07XXXA Unspecified multiple injuries, initial encounter: Secondary | ICD-10-CM

## 2017-04-27 DIAGNOSIS — E7800 Pure hypercholesterolemia, unspecified: Secondary | ICD-10-CM | POA: Diagnosis present

## 2017-04-27 DIAGNOSIS — I10 Essential (primary) hypertension: Secondary | ICD-10-CM | POA: Diagnosis present

## 2017-04-27 DIAGNOSIS — S52562A Barton's fracture of left radius, initial encounter for closed fracture: Secondary | ICD-10-CM | POA: Diagnosis not present

## 2017-04-27 DIAGNOSIS — Z888 Allergy status to other drugs, medicaments and biological substances status: Secondary | ICD-10-CM

## 2017-04-27 DIAGNOSIS — J45909 Unspecified asthma, uncomplicated: Secondary | ICD-10-CM | POA: Diagnosis present

## 2017-04-27 DIAGNOSIS — F419 Anxiety disorder, unspecified: Secondary | ICD-10-CM | POA: Diagnosis present

## 2017-04-27 DIAGNOSIS — S62102A Fracture of unspecified carpal bone, left wrist, initial encounter for closed fracture: Secondary | ICD-10-CM | POA: Diagnosis present

## 2017-04-27 DIAGNOSIS — S61411A Laceration without foreign body of right hand, initial encounter: Secondary | ICD-10-CM | POA: Diagnosis present

## 2017-04-27 DIAGNOSIS — S80211A Abrasion, right knee, initial encounter: Secondary | ICD-10-CM | POA: Diagnosis present

## 2017-04-27 DIAGNOSIS — Z23 Encounter for immunization: Secondary | ICD-10-CM

## 2017-04-27 DIAGNOSIS — S0181XA Laceration without foreign body of other part of head, initial encounter: Secondary | ICD-10-CM | POA: Diagnosis present

## 2017-04-27 DIAGNOSIS — S52612A Displaced fracture of left ulna styloid process, initial encounter for closed fracture: Secondary | ICD-10-CM | POA: Diagnosis present

## 2017-04-27 HISTORY — PX: OPEN REDUCTION INTERNAL FIXATION (ORIF) DISTAL RADIAL FRACTURE: SHX5989

## 2017-04-27 LAB — URINALYSIS, ROUTINE W REFLEX MICROSCOPIC
Bilirubin Urine: NEGATIVE
GLUCOSE, UA: NEGATIVE mg/dL
HGB URINE DIPSTICK: NEGATIVE
Ketones, ur: NEGATIVE mg/dL
Leukocytes, UA: NEGATIVE
Nitrite: NEGATIVE
PH: 6 (ref 5.0–8.0)
PROTEIN: NEGATIVE mg/dL
Specific Gravity, Urine: 1.018 (ref 1.005–1.030)

## 2017-04-27 LAB — SAMPLE TO BLOOD BANK

## 2017-04-27 LAB — I-STAT CHEM 8, ED
BUN: 18 mg/dL (ref 6–20)
CHLORIDE: 102 mmol/L (ref 101–111)
Calcium, Ion: 1.06 mmol/L — ABNORMAL LOW (ref 1.15–1.40)
Creatinine, Ser: 0.8 mg/dL (ref 0.61–1.24)
Glucose, Bld: 109 mg/dL — ABNORMAL HIGH (ref 65–99)
HEMATOCRIT: 38 % — AB (ref 39.0–52.0)
Hemoglobin: 12.9 g/dL — ABNORMAL LOW (ref 13.0–17.0)
POTASSIUM: 3.3 mmol/L — AB (ref 3.5–5.1)
SODIUM: 140 mmol/L (ref 135–145)
TCO2: 28 mmol/L (ref 22–32)

## 2017-04-27 LAB — CBC
HCT: 38 % — ABNORMAL LOW (ref 39.0–52.0)
HEMOGLOBIN: 13.3 g/dL (ref 13.0–17.0)
MCH: 33.2 pg (ref 26.0–34.0)
MCHC: 35 g/dL (ref 30.0–36.0)
MCV: 94.8 fL (ref 78.0–100.0)
PLATELETS: 258 10*3/uL (ref 150–400)
RBC: 4.01 MIL/uL — ABNORMAL LOW (ref 4.22–5.81)
RDW: 13.5 % (ref 11.5–15.5)
WBC: 7.9 10*3/uL (ref 4.0–10.5)

## 2017-04-27 LAB — I-STAT CG4 LACTIC ACID, ED: Lactic Acid, Venous: 1.05 mmol/L (ref 0.5–1.9)

## 2017-04-27 LAB — COMPREHENSIVE METABOLIC PANEL
ALBUMIN: 3.7 g/dL (ref 3.5–5.0)
ALT: 21 U/L (ref 17–63)
AST: 24 U/L (ref 15–41)
Alkaline Phosphatase: 40 U/L (ref 38–126)
Anion gap: 10 (ref 5–15)
BUN: 15 mg/dL (ref 6–20)
CALCIUM: 9 mg/dL (ref 8.9–10.3)
CO2: 25 mmol/L (ref 22–32)
CREATININE: 0.88 mg/dL (ref 0.61–1.24)
Chloride: 104 mmol/L (ref 101–111)
GFR calc Af Amer: >60 mL/min (ref >60)
GFR calc non Af Amer: >60 mL/min (ref >60)
GLUCOSE: 114 mg/dL — AB (ref 65–99)
Potassium: 3.3 mmol/L — ABNORMAL LOW (ref 3.5–5.1)
SODIUM: 139 mmol/L (ref 135–145)
Total Bilirubin: 0.6 mg/dL (ref 0.3–1.2)
Total Protein: 6.2 g/dL — ABNORMAL LOW (ref 6.5–8.1)

## 2017-04-27 LAB — PROTIME-INR
INR: 0.89
Prothrombin Time: 12 seconds (ref 11.4–15.2)

## 2017-04-27 LAB — CDS SEROLOGY

## 2017-04-27 LAB — ETHANOL

## 2017-04-27 SURGERY — OPEN REDUCTION INTERNAL FIXATION (ORIF) DISTAL RADIUS FRACTURE
Anesthesia: General | Laterality: Left

## 2017-04-27 MED ORDER — ONDANSETRON HCL 4 MG/2ML IJ SOLN
INTRAMUSCULAR | Status: DC | PRN
  Administered 2017-04-27: 4 mg via INTRAVENOUS

## 2017-04-27 MED ORDER — PANTOPRAZOLE SODIUM 40 MG PO TBEC: 40.0000 mg | DELAYED_RELEASE_TABLET | Freq: Two times a day (BID) | ORAL | Status: DC | PRN

## 2017-04-27 MED ORDER — LIDOCAINE HCL (CARDIAC) 20 MG/ML IV SOLN
INTRAVENOUS | Status: DC | PRN
  Administered 2017-04-27: 80 mg via INTRAVENOUS

## 2017-04-27 MED ORDER — CEFAZOLIN SODIUM-DEXTROSE 2-4 GM/100ML-% IV SOLN
2.0000 g | Freq: Once | INTRAVENOUS | Status: AC
  Administered 2017-04-27: 2 g via INTRAVENOUS
  Filled 2017-04-27: qty 100

## 2017-04-27 MED ORDER — FENTANYL CITRATE (PF) 100 MCG/2ML IJ SOLN
INTRAMUSCULAR | Status: DC | PRN
  Administered 2017-04-27 (×2): 100 ug via INTRAVENOUS
  Administered 2017-04-27: 50 ug via INTRAVENOUS

## 2017-04-27 MED ORDER — TETANUS-DIPHTH-ACELL PERTUSSIS 5-2.5-18.5 LF-MCG/0.5 IM SUSP
0.5000 mL | Freq: Once | INTRAMUSCULAR | Status: AC
  Administered 2017-04-27: 0.5 mL via INTRAMUSCULAR
  Filled 2017-04-27: qty 0.5

## 2017-04-27 MED ORDER — ESCITALOPRAM OXALATE 10 MG PO TABS: 10.0000 mg | ORAL_TABLET | Freq: Every day | ORAL | Status: DC

## 2017-04-27 MED ORDER — 0.9 % SODIUM CHLORIDE (POUR BTL) OPTIME
TOPICAL | Status: DC | PRN
  Administered 2017-04-27: 1000 mL

## 2017-04-27 MED ORDER — HYDROMORPHONE HCL 1 MG/ML IJ SOLN
0.5000 mg | INTRAMUSCULAR | Status: DC | PRN
  Administered 2017-04-27: 0.5 mg via INTRAVENOUS

## 2017-04-27 MED ORDER — METHOCARBAMOL 1000 MG/10ML IJ SOLN
500.0000 mg | Freq: Four times a day (QID) | INTRAMUSCULAR | Status: DC | PRN
  Filled 2017-04-27: qty 5

## 2017-04-27 MED ORDER — CEFAZOLIN SODIUM-DEXTROSE 1-4 GM/50ML-% IV SOLN
1.0000 g | INTRAVENOUS | Status: AC
  Administered 2017-04-27: 1 g via INTRAVENOUS
  Filled 2017-04-27: qty 50

## 2017-04-27 MED ORDER — VITAMIN C 500 MG PO TABS
1000.0000 mg | ORAL_TABLET | Freq: Every day | ORAL | Status: DC
  Administered 2017-04-28 – 2017-04-29 (×2): 1000 mg via ORAL
  Filled 2017-04-27 (×2): qty 2

## 2017-04-27 MED ORDER — MORPHINE SULFATE (PF) 4 MG/ML IV SOLN
1.0000 mg | INTRAVENOUS | Status: DC | PRN
  Administered 2017-04-27 – 2017-04-29 (×12): 1 mg via INTRAVENOUS
  Filled 2017-04-27 (×12): qty 1

## 2017-04-27 MED ORDER — FENTANYL CITRATE (PF) 250 MCG/5ML IJ SOLN
INTRAMUSCULAR | Status: AC
  Filled 2017-04-27: qty 5

## 2017-04-27 MED ORDER — PROPOFOL 10 MG/ML IV BOLUS
INTRAVENOUS | Status: AC
  Filled 2017-04-27: qty 20

## 2017-04-27 MED ORDER — SENNA 8.6 MG PO TABS
1.0000 | ORAL_TABLET | Freq: Two times a day (BID) | ORAL | Status: DC
  Administered 2017-04-28 – 2017-04-29 (×3): 8.6 mg via ORAL
  Filled 2017-04-27 (×3): qty 1

## 2017-04-27 MED ORDER — MIDAZOLAM HCL 2 MG/2ML IJ SOLN
INTRAMUSCULAR | Status: AC
  Filled 2017-04-27: qty 2

## 2017-04-27 MED ORDER — ONDANSETRON HCL 4 MG PO TABS: 4.0000 mg | ORAL_TABLET | Freq: Four times a day (QID) | ORAL | Status: DC | PRN

## 2017-04-27 MED ORDER — FENTANYL CITRATE (PF) 100 MCG/2ML IJ SOLN
50.0000 ug | Freq: Once | INTRAMUSCULAR | Status: AC
  Administered 2017-04-27: 50 ug via INTRAVENOUS
  Filled 2017-04-27: qty 2

## 2017-04-27 MED ORDER — FENTANYL CITRATE (PF) 100 MCG/2ML IJ SOLN
25.0000 ug | INTRAMUSCULAR | Status: DC | PRN
  Administered 2017-04-27 (×3): 50 ug via INTRAVENOUS

## 2017-04-27 MED ORDER — PROMETHAZINE HCL 25 MG RE SUPP: 12.5000 mg | Freq: Four times a day (QID) | RECTAL | Status: DC | PRN

## 2017-04-27 MED ORDER — DEXAMETHASONE SODIUM PHOSPHATE 4 MG/ML IJ SOLN
INTRAMUSCULAR | Status: DC | PRN
  Administered 2017-04-27: 10 mg via INTRAVENOUS

## 2017-04-27 MED ORDER — CEFAZOLIN SODIUM-DEXTROSE 1-4 GM/50ML-% IV SOLN
INTRAVENOUS | Status: AC
  Filled 2017-04-27: qty 50

## 2017-04-27 MED ORDER — OXYCODONE HCL 5 MG PO TABS
ORAL_TABLET | ORAL | Status: AC
  Filled 2017-04-27: qty 2

## 2017-04-27 MED ORDER — MIDAZOLAM HCL 5 MG/5ML IJ SOLN
INTRAMUSCULAR | Status: DC | PRN
  Administered 2017-04-27: 2 mg via INTRAVENOUS

## 2017-04-27 MED ORDER — OXYCODONE HCL 5 MG PO TABS
5.0000 mg | ORAL_TABLET | ORAL | Status: DC | PRN
  Administered 2017-04-27 – 2017-04-29 (×9): 10 mg via ORAL
  Administered 2017-04-29: 5 mg via ORAL
  Administered 2017-04-29 (×2): 10 mg via ORAL
  Filled 2017-04-27 (×11): qty 2

## 2017-04-27 MED ORDER — CEFAZOLIN SODIUM-DEXTROSE 2-4 GM/100ML-% IV SOLN
INTRAVENOUS | Status: AC
  Filled 2017-04-27: qty 100

## 2017-04-27 MED ORDER — ALPRAZOLAM 0.25 MG PO TABS
0.2500 mg | ORAL_TABLET | Freq: Four times a day (QID) | ORAL | Status: DC | PRN
  Administered 2017-04-27 – 2017-04-29 (×4): 0.25 mg via ORAL
  Filled 2017-04-27 (×4): qty 1

## 2017-04-27 MED ORDER — MOMETASONE FURO-FORMOTEROL FUM 100-5 MCG/ACT IN AERO
2.0000 | INHALATION_SPRAY | Freq: Two times a day (BID) | RESPIRATORY_TRACT | Status: DC
  Administered 2017-04-28 – 2017-04-29 (×3): 2 via RESPIRATORY_TRACT
  Filled 2017-04-27: qty 8.8

## 2017-04-27 MED ORDER — FENTANYL CITRATE (PF) 100 MCG/2ML IJ SOLN
INTRAMUSCULAR | Status: AC
  Filled 2017-04-27: qty 2

## 2017-04-27 MED ORDER — DOUBLE ANTIBIOTIC 500-10000 UNIT/GM EX OINT
TOPICAL_OINTMENT | CUTANEOUS | Status: AC
  Filled 2017-04-27: qty 1

## 2017-04-27 MED ORDER — FENTANYL CITRATE (PF) 100 MCG/2ML IJ SOLN
100.0000 ug | Freq: Once | INTRAMUSCULAR | Status: AC
  Administered 2017-04-27: 100 ug via INTRAVENOUS
  Filled 2017-04-27: qty 2

## 2017-04-27 MED ORDER — SODIUM CHLORIDE 0.9 % IR SOLN
Status: DC | PRN
  Administered 2017-04-27: 3000 mL

## 2017-04-27 MED ORDER — CEFAZOLIN SODIUM-DEXTROSE 1-4 GM/50ML-% IV SOLN
1.0000 g | Freq: Three times a day (TID) | INTRAVENOUS | Status: DC
  Administered 2017-04-28 – 2017-04-29 (×4): 1 g via INTRAVENOUS
  Filled 2017-04-27 (×6): qty 50

## 2017-04-27 MED ORDER — BUPIVACAINE HCL (PF) 0.5 % IJ SOLN
INTRAMUSCULAR | Status: AC
  Filled 2017-04-27: qty 10

## 2017-04-27 MED ORDER — SODIUM CHLORIDE 0.9 % IV BOLUS (SEPSIS)
1000.0000 mL | Freq: Once | INTRAVENOUS | Status: AC
  Administered 2017-04-27: 1000 mL via INTRAVENOUS

## 2017-04-27 MED ORDER — BENAZEPRIL-HYDROCHLOROTHIAZIDE 20-25 MG PO TABS: 1.0000 | ORAL_TABLET | Freq: Every day | ORAL | Status: DC

## 2017-04-27 MED ORDER — SODIUM CHLORIDE 0.45 % IV SOLN
INTRAVENOUS | Status: DC
  Administered 2017-04-27: 22:00:00 via INTRAVENOUS

## 2017-04-27 MED ORDER — PROPOFOL 10 MG/ML IV BOLUS
INTRAVENOUS | Status: DC | PRN
  Administered 2017-04-27: 150 mg via INTRAVENOUS
  Administered 2017-04-27: 50 mg via INTRAVENOUS

## 2017-04-27 MED ORDER — SUGAMMADEX SODIUM 200 MG/2ML IV SOLN
INTRAVENOUS | Status: DC | PRN
  Administered 2017-04-27: 100 mg via INTRAVENOUS

## 2017-04-27 MED ORDER — ONDANSETRON HCL 4 MG/2ML IJ SOLN
4.0000 mg | Freq: Four times a day (QID) | INTRAMUSCULAR | Status: DC | PRN
  Administered 2017-04-28: 4 mg via INTRAVENOUS
  Filled 2017-04-27: qty 2

## 2017-04-27 MED ORDER — ONDANSETRON HCL 4 MG/2ML IJ SOLN
4.0000 mg | Freq: Once | INTRAMUSCULAR | Status: DC | PRN
Start: 2017-04-27 — End: 2017-04-27

## 2017-04-27 MED ORDER — ADULT MULTIVITAMIN W/MINERALS CH
1.0000 | ORAL_TABLET | Freq: Every day | ORAL | Status: DC
  Administered 2017-04-28 – 2017-04-29 (×2): 1 via ORAL
  Filled 2017-04-27 (×2): qty 1

## 2017-04-27 MED ORDER — HYDROMORPHONE HCL 1 MG/ML IJ SOLN
INTRAMUSCULAR | Status: AC
  Filled 2017-04-27: qty 1

## 2017-04-27 MED ORDER — LACTATED RINGERS IV SOLN
INTRAVENOUS | Status: DC
  Administered 2017-04-27 (×2): via INTRAVENOUS

## 2017-04-27 MED ORDER — ROCURONIUM BROMIDE 100 MG/10ML IV SOLN
INTRAVENOUS | Status: DC | PRN
  Administered 2017-04-27: 40 mg via INTRAVENOUS

## 2017-04-27 MED ORDER — METHOCARBAMOL 500 MG PO TABS
500.0000 mg | ORAL_TABLET | Freq: Four times a day (QID) | ORAL | Status: DC | PRN
  Administered 2017-04-27 – 2017-04-29 (×6): 500 mg via ORAL
  Filled 2017-04-27 (×7): qty 1

## 2017-04-27 SURGICAL SUPPLY — 75 items
BANDAGE ACE 3X5.8 VEL STRL LF (GAUZE/BANDAGES/DRESSINGS) ×3 IMPLANT
BANDAGE ACE 4X5 VEL STRL LF (GAUZE/BANDAGES/DRESSINGS) ×3 IMPLANT
BANDAGE ELASTIC 3 VELCRO ST LF (GAUZE/BANDAGES/DRESSINGS) ×2 IMPLANT
BANDAGE ELASTIC 4 VELCRO ST LF (GAUZE/BANDAGES/DRESSINGS) ×6 IMPLANT
BANDAGE ELASTIC 6 VELCRO ST LF (GAUZE/BANDAGES/DRESSINGS) ×2 IMPLANT
BIT DRILL 2.2 SS TIBIAL (BIT) ×2 IMPLANT
BLADE CLIPPER SURG (BLADE) IMPLANT
BNDG CMPR 9X4 STRL LF SNTH (GAUZE/BANDAGES/DRESSINGS) ×1
BNDG ESMARK 4X9 LF (GAUZE/BANDAGES/DRESSINGS) ×3 IMPLANT
BNDG GAUZE ELAST 4 BULKY (GAUZE/BANDAGES/DRESSINGS) ×7 IMPLANT
CORDS BIPOLAR (ELECTRODE) ×3 IMPLANT
COVER SURGICAL LIGHT HANDLE (MISCELLANEOUS) ×3 IMPLANT
CUFF TOURNIQUET SINGLE 18IN (TOURNIQUET CUFF) ×3 IMPLANT
CUFF TOURNIQUET SINGLE 24IN (TOURNIQUET CUFF) IMPLANT
DECANTER SPIKE VIAL GLASS SM (MISCELLANEOUS) IMPLANT
DRAIN TLS ROUND 10FR (DRAIN) IMPLANT
DRAPE OEC MINIVIEW 54X84 (DRAPES) IMPLANT
DRAPE U-SHAPE 47X51 STRL (DRAPES) ×3 IMPLANT
DRESSING ADAPTIC 1/2  N-ADH (PACKING) ×2 IMPLANT
DRSG ADAPTIC 3X8 NADH LF (GAUZE/BANDAGES/DRESSINGS) ×3 IMPLANT
DRSG PAD ABDOMINAL 8X10 ST (GAUZE/BANDAGES/DRESSINGS) ×2 IMPLANT
GAUZE SPONGE 4X4 12PLY STRL (GAUZE/BANDAGES/DRESSINGS) ×3 IMPLANT
GAUZE SPONGE 4X4 12PLY STRL LF (GAUZE/BANDAGES/DRESSINGS) ×2 IMPLANT
GAUZE SPONGE 4X4 16PLY XRAY LF (GAUZE/BANDAGES/DRESSINGS) ×4 IMPLANT
GAUZE XEROFORM 1X8 LF (GAUZE/BANDAGES/DRESSINGS) ×4 IMPLANT
GAUZE XEROFORM 5X9 LF (GAUZE/BANDAGES/DRESSINGS) ×3 IMPLANT
GLOVE BIOGEL M 8.0 STRL (GLOVE) ×3 IMPLANT
GLOVE SS BIOGEL STRL SZ 8 (GLOVE) ×1 IMPLANT
GLOVE SUPERSENSE BIOGEL SZ 8 (GLOVE) ×2
GOWN STRL REUS W/ TWL LRG LVL3 (GOWN DISPOSABLE) ×3 IMPLANT
GOWN STRL REUS W/ TWL XL LVL3 (GOWN DISPOSABLE) ×3 IMPLANT
GOWN STRL REUS W/TWL LRG LVL3 (GOWN DISPOSABLE) ×9
GOWN STRL REUS W/TWL XL LVL3 (GOWN DISPOSABLE) ×9
KIT BASIN OR (CUSTOM PROCEDURE TRAY) ×3 IMPLANT
KIT ROOM TURNOVER OR (KITS) ×3 IMPLANT
MANIFOLD NEPTUNE II (INSTRUMENTS) ×3 IMPLANT
NEEDLE 22X1 1/2 (OR ONLY) (NEEDLE) IMPLANT
NS IRRIG 1000ML POUR BTL (IV SOLUTION) ×3 IMPLANT
PACK ORTHO EXTREMITY (CUSTOM PROCEDURE TRAY) ×3 IMPLANT
PAD ARMBOARD 7.5X6 YLW CONV (MISCELLANEOUS) ×6 IMPLANT
PAD CAST 3X4 CTTN HI CHSV (CAST SUPPLIES) IMPLANT
PAD CAST 4YDX4 CTTN HI CHSV (CAST SUPPLIES) ×1 IMPLANT
PADDING CAST COTTON 3X4 STRL (CAST SUPPLIES) ×3
PADDING CAST COTTON 4X4 STRL (CAST SUPPLIES) ×3
PADDING CAST SYNTHETIC 4 (CAST SUPPLIES) ×2
PADDING CAST SYNTHETIC 4X4 STR (CAST SUPPLIES) IMPLANT
PEG LOCKING SMOOTH 2.2X20 (Screw) ×2 IMPLANT
PEG LOCKING SMOOTH 2.2X24 (Peg) ×8 IMPLANT
PEG LOCKING SMOOTH 2.2X26 (Peg) ×2 IMPLANT
PLATE STD DVR LEFT (Plate) ×3 IMPLANT
PLATE STD DVR LT 24X55 (Plate) IMPLANT
SCREW LOCK 16X2.7X 3 LD TPR (Screw) IMPLANT
SCREW LOCK 18X2.7X 3 LD TPR (Screw) IMPLANT
SCREW LOCK 26X2.7X 3 LD TPR (Screw) IMPLANT
SCREW LOCKING 2.7X16 (Screw) ×6 IMPLANT
SCREW LOCKING 2.7X18 (Screw) ×6 IMPLANT
SCREW LOCKING 2.7X26MM (Screw) ×3 IMPLANT
SCRUB BETADINE 4OZ XXX (MISCELLANEOUS) ×3 IMPLANT
SOL PREP POV-IOD 4OZ 10% (MISCELLANEOUS) ×3 IMPLANT
SPLINT FIBERGLASS 4X30 (CAST SUPPLIES) ×2 IMPLANT
SPONGE LAP 4X18 X RAY DECT (DISPOSABLE) IMPLANT
SUT CHROMIC 4 0 PS 2 18 (SUTURE) ×2 IMPLANT
SUT MNCRL AB 4-0 PS2 18 (SUTURE) ×3 IMPLANT
SUT PROLENE 3 0 PS 2 (SUTURE) IMPLANT
SUT PROLENE 4 0 PS 2 18 (SUTURE) ×10 IMPLANT
SUT VIC AB 3-0 FS2 27 (SUTURE) IMPLANT
SYR CONTROL 10ML LL (SYRINGE) IMPLANT
SYSTEM CHEST DRAIN TLS 7FR (DRAIN) ×2 IMPLANT
TOWEL OR 17X24 6PK STRL BLUE (TOWEL DISPOSABLE) ×5 IMPLANT
TOWEL OR 17X26 10 PK STRL BLUE (TOWEL DISPOSABLE) ×3 IMPLANT
TUBE CONNECTING 12'X1/4 (SUCTIONS) ×1
TUBE CONNECTING 12X1/4 (SUCTIONS) ×2 IMPLANT
TUBE EVACUATION TLS (MISCELLANEOUS) ×3 IMPLANT
UNDERPAD 30X30 (UNDERPADS AND DIAPERS) ×3 IMPLANT
WATER STERILE IRR 1000ML POUR (IV SOLUTION) ×3 IMPLANT
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
## 2017-04-27 NOTE — Transfer of Care (Signed)
Immediate Anesthesia Transfer of Care Note  Patient: Lee Compton  Procedure(s) Performed: Procedure(s): IRRIGATION AND DEBRIDEMENT WITH OPEN REDUCTION INTERNAL FIXATION (ORIF) DISTAL RADIAL FRACTURE (Left)  Patient Location: PACU  Anesthesia Type:General  Level of Consciousness: awake and alert   Airway & Oxygen Therapy: Patient Spontanous Breathing and Patient connected to nasal cannula oxygen  Post-op Assessment: Report given to RN and Post -op Vital signs reviewed and stable  Post vital signs: Reviewed and stable  Last Vitals:  Vitals:   04/27/17 1500 04/27/17 1600  BP: (!) 153/93 (!) 154/91  Pulse: 81 79  Resp:  17  Temp:    SpO2: 98% 98%    Last Pain:  Vitals:   04/27/17 1614  TempSrc:   PainSc: 8          Complications: No apparent anesthesia complications

## 2017-04-27 NOTE — Anesthesia Postprocedure Evaluation (Signed)
Anesthesia Post Note  Patient: Lee Compton  Procedure(s) Performed: Procedure(s) (LRB): IRRIGATION AND DEBRIDEMENT WITH OPEN REDUCTION INTERNAL FIXATION (ORIF) DISTAL RADIAL FRACTURE (Left)     Patient location during evaluation: PACU Anesthesia Type: General Level of consciousness: awake and alert Pain management: pain level controlled Vital Signs Assessment: post-procedure vital signs reviewed and stable Respiratory status: spontaneous breathing, nonlabored ventilation, respiratory function stable and patient connected to nasal cannula oxygen Cardiovascular status: blood pressure returned to baseline and stable Postop Assessment: no signs of nausea or vomiting Anesthetic complications: no    Last Vitals:  Vitals:   04/27/17 2100 04/27/17 2128  BP: (!) 162/96 (!) 169/97  Pulse: 60 60  Resp: (!) 7 16  Temp: 36.8 C 36.6 C  SpO2: 99% 99%    Last Pain:  Vitals:   04/27/17 2203  TempSrc:   PainSc: Lee Compton

## 2017-04-27 NOTE — Anesthesia Procedure Notes (Signed)
Procedure Name: Intubation Date/Time: 04/27/2017 5:59 PM Performed by: Clearnce Sorrel Pre-anesthesia Checklist: Patient identified, Emergency Drugs available, Suction available, Patient being monitored and Timeout performed Patient Re-evaluated:Patient Re-evaluated prior to induction Oxygen Delivery Method: Circle system utilized Preoxygenation: Pre-oxygenation with 100% oxygen Induction Type: IV induction, Rapid sequence and Cricoid Pressure applied Laryngoscope Size: Mac and 4 Grade View: Grade I Tube type: Oral Tube size: 7.5 mm Number of attempts: 1 Airway Equipment and Method: Stylet Placement Confirmation: ETT inserted through vocal cords under direct vision,  positive ETCO2 and breath sounds checked- equal and bilateral Secured at: 22 cm Tube secured with: Tape Dental Injury: Teeth and Oropharynx as per pre-operative assessment

## 2017-04-27 NOTE — Anesthesia Preprocedure Evaluation (Addendum)
Anesthesia Evaluation  Patient identified by MRN, date of birth, ID band Patient awake    Reviewed: Allergy & Precautions, NPO status , Patient's Chart, lab work & pertinent test results  Airway Mallampati: II  TM Distance: >3 FB Neck ROM: Full    Dental  (+) Edentulous Upper, Edentulous Lower   Pulmonary asthma , former smoker,    Pulmonary exam normal breath sounds clear to auscultation       Cardiovascular hypertension, Normal cardiovascular exam Rhythm:Regular Rate:Normal     Neuro/Psych Anxiety negative neurological ROS     GI/Hepatic negative GI ROS, Neg liver ROS,   Endo/Other  negative endocrine ROS  Renal/GU negative Renal ROS  negative genitourinary   Musculoskeletal negative musculoskeletal ROS (+)   Abdominal   Peds  Hematology negative hematology ROS (+)   Anesthesia Other Findings C-collar cleared by Dr. Amedeo Plenty in preop  Reproductive/Obstetrics                          Anesthesia Physical Anesthesia Plan  ASA: II and emergent  Anesthesia Plan: General   Post-op Pain Management:    Induction: Intravenous, Rapid sequence and Cricoid pressure planned  PONV Risk Score and Plan: 3 and Ondansetron, Dexamethasone, Propofol infusion and Treatment may vary due to age or medical condition  Airway Management Planned: Oral ETT  Additional Equipment:   Intra-op Plan:   Post-operative Plan: Extubation in OR  Informed Consent: I have reviewed the patients History and Physical, chart, labs and discussed the procedure including the risks, benefits and alternatives for the proposed anesthesia with the patient or authorized representative who has indicated his/her understanding and acceptance.   Dental advisory given  Plan Discussed with: CRNA  Anesthesia Plan Comments:     Anesthesia Quick Evaluation
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
## 2017-04-27 NOTE — ED Notes (Signed)
Pt transported to xray and then to be transported to CT.
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
## 2017-04-28 DIAGNOSIS — F419 Anxiety disorder, unspecified: Secondary | ICD-10-CM | POA: Diagnosis present

## 2017-04-28 DIAGNOSIS — E7800 Pure hypercholesterolemia, unspecified: Secondary | ICD-10-CM | POA: Diagnosis present

## 2017-04-28 DIAGNOSIS — S0181XA Laceration without foreign body of other part of head, initial encounter: Secondary | ICD-10-CM | POA: Diagnosis present

## 2017-04-28 DIAGNOSIS — Z87891 Personal history of nicotine dependence: Secondary | ICD-10-CM | POA: Diagnosis not present

## 2017-04-28 DIAGNOSIS — Z888 Allergy status to other drugs, medicaments and biological substances status: Secondary | ICD-10-CM | POA: Diagnosis not present

## 2017-04-28 DIAGNOSIS — S80211A Abrasion, right knee, initial encounter: Secondary | ICD-10-CM | POA: Diagnosis present

## 2017-04-28 DIAGNOSIS — S61411A Laceration without foreign body of right hand, initial encounter: Secondary | ICD-10-CM | POA: Diagnosis present

## 2017-04-28 DIAGNOSIS — S52562A Barton's fracture of left radius, initial encounter for closed fracture: Secondary | ICD-10-CM | POA: Diagnosis present

## 2017-04-28 DIAGNOSIS — J45909 Unspecified asthma, uncomplicated: Secondary | ICD-10-CM | POA: Diagnosis present

## 2017-04-28 DIAGNOSIS — S52612A Displaced fracture of left ulna styloid process, initial encounter for closed fracture: Secondary | ICD-10-CM | POA: Diagnosis present

## 2017-04-28 DIAGNOSIS — I10 Essential (primary) hypertension: Secondary | ICD-10-CM | POA: Diagnosis present

## 2017-04-28 DIAGNOSIS — Z23 Encounter for immunization: Secondary | ICD-10-CM | POA: Diagnosis present
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
## 2017-04-29 ENCOUNTER — Encounter (HOSPITAL_COMMUNITY): Payer: Self-pay | Admitting: Orthopedic Surgery

## 2017-04-29 NOTE — Progress Notes (Signed)
CSW met with patient to discuss discharge plan and prior recommendation for SNF placement, and patient indicated that he will be going to stay with his daughter at discharge for a few days until he is better able to provide his own care. Per most recent PT note, patient's daughter is in agreement and recommendations are now for home health instead of SNF placement.  CSW received call from OT for patient needing a 3-in-1; CSW alerted RNCM.   CSW not needed for discharge planning. CSW signing off.  Laveda Abbe, [place] Clinical Social Worker [phone]

## 2017-04-29 NOTE — Care Management Note (Signed)
Case Management Note  Patient Details  Name: Lee Compton MRN: [id number] Date of Birth: 03-16-1941  Subjective/Objective:                    Action/Plan:  Spoke with patient at bedside who consented for NCM to call his daughter Lee Compton [phone] .  Spoke with Lee Compton , she plans on patient staying with her at discharge at [address] [place], [place] [address] phone [phone].  She would like home health through [facility].   OT recommending 3 in 1. [facility] currently does not have any 3 in 1 in hospital . [facility] can mail one to daughter or daughter can pick one up at their store on [employer]. Daughter prefers to pick one up tomorrow at store tomorrow . She will need copy of order.  Bedside nurse aware. Called DR [employer] answering service . Need home health orders and face to face and order for 3 in 1. Brad aware and will enter orders  Expected Discharge Date:  04/29/17               Expected Discharge Plan:  [place]  In-House Referral:     Discharge planning Services  CM Consult  Post Acute Care Choice:  Home Health, Durable Medical Equipment Choice offered to:  Patient, Adult Children  DME Arranged:  3-N-1 DME Agency:  [place]:  PT, OT [facility] Agency:  [place]  Status of Service:  In process, will continue to follow  If discussed at Long Length of Stay Meetings, dates discussed:    Additional Comments:  Marilu Favre, RN 04/29/2017, 12:44 PM

## 2017-04-29 NOTE — Progress Notes (Signed)
Patient educated on management of wounds on right knee and right lower arm/elbow area.  Patient instructed on changing of aforementioned dressings and infection prevention (including handwashing, use of bacitracin, and clean technique for dressing changes).  Comprehension of instructions ascertained via "teach-back" technique.  Discharge instructions reviewed with patient.  These included (but were not limited to) the following:  Medication (with potential side effects), activity, use of adjunctive methods of pain control (including use of ice packs), follow-up appointments, cast care, activity restrictions, driving restrictions, etc.  Awaiting daughter's arrival to review discharge information with her as well, including wound care.

## 2017-04-29 NOTE — Progress Notes (Signed)
Orthopedic Tech Progress Note Patient Details:  Lee Compton 1940/09/26 [id number]  Ortho Devices Type of Ortho Device: Arm sling Ortho Device/Splint Location: Proviced and applied arm sling to pt left arm.  pt tolerated application very well.  Left Arm.  Ortho Device/Splint Interventions: Application, Adjustment   Kristopher Oppenheim 04/29/2017, 2:56 PM
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
## 2017-04-29 NOTE — Discharge Instructions (Signed)
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
## 2017-04-29 NOTE — Plan of Care (Signed)
Problem: Pain Managment: Goal: General experience of comfort will improve Outcome: Progressing Patient voices understanding of pain scale and calls for pain medication when needed   

## 2017-12-24 ENCOUNTER — Encounter: Payer: Self-pay | Admitting: Gastroenterology

## 2017-12-24 ENCOUNTER — Other Ambulatory Visit (INDEPENDENT_AMBULATORY_CARE_PROVIDER_SITE_OTHER): Payer: Medicare Other

## 2017-12-24 ENCOUNTER — Ambulatory Visit: Payer: Medicare Other | Admitting: Gastroenterology

## 2017-12-24 VITALS — BP 158/86 | HR 68 | Ht 70.0 in | Wt 202.0 lb

## 2017-12-24 DIAGNOSIS — R197 Diarrhea, unspecified: Secondary | ICD-10-CM | POA: Diagnosis not present

## 2017-12-24 DIAGNOSIS — Z86010 Personal history of colonic polyps: Secondary | ICD-10-CM

## 2017-12-24 DIAGNOSIS — R152 Fecal urgency: Secondary | ICD-10-CM

## 2017-12-24 LAB — IGA: IGA: 173 mg/dL (ref 68–378)

## 2017-12-24 NOTE — Patient Instructions (Addendum)
Your provider has requested that you go to the basement level for lab work before leaving today. Press "B" on the elevator. The lab is located at the first door on the left as you exit the elevator.   Stop taking colchicine x 3 days to assess symptoms. If no improvement in symptoms then stop all milk products x 5 days. Still no improvement in symptoms then return stool studies.   Normal BMI (Body Mass Index- based on height and weight) is between 23 and 30. Your BMI today is Body mass index is 28.98 kg/m. Marland Kitchen Please consider follow up  regarding your BMI with your Primary Care Provider.  Thank you for choosing me and [facility].  Pricilla Riffle. Dagoberto Ligas., MD., Marval Regal
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
## 2017-12-25 ENCOUNTER — Other Ambulatory Visit: Payer: Medicare Other

## 2017-12-25 DIAGNOSIS — R197 Diarrhea, unspecified: Secondary | ICD-10-CM

## 2017-12-25 DIAGNOSIS — R152 Fecal urgency: Secondary | ICD-10-CM

## 2017-12-25 LAB — TISSUE TRANSGLUTAMINASE, IGA: (tTG) Ab, IgA: 1 U/mL

## 2018-01-01 LAB — GASTROINTESTINAL PATHOGEN PANEL PCR
C. DIFFICILE TOX A/B, PCR: NOT DETECTED
Campylobacter, PCR: NOT DETECTED
Cryptosporidium, PCR: NOT DETECTED
E COLI (STEC) STX1/STX2, PCR: NOT DETECTED
E coli (ETEC) LT/ST PCR: NOT DETECTED
E coli 0157, PCR: NOT DETECTED
Giardia lamblia, PCR: NOT DETECTED
Norovirus, PCR: NOT DETECTED
ROTAVIRUS, PCR: NOT DETECTED
SALMONELLA, PCR: NOT DETECTED
SHIGELLA, PCR: NOT DETECTED

## 2018-01-01 LAB — PANCREATIC ELASTASE, FECAL

## 2018-01-23 ENCOUNTER — Encounter (INDEPENDENT_AMBULATORY_CARE_PROVIDER_SITE_OTHER): Payer: Self-pay

## 2018-01-23 ENCOUNTER — Ambulatory Visit: Payer: Medicare Other | Admitting: Nurse Practitioner

## 2018-01-23 ENCOUNTER — Encounter: Payer: Self-pay | Admitting: Nurse Practitioner

## 2018-01-23 VITALS — BP 140/80 | HR 82 | Ht 70.0 in | Wt 203.6 lb

## 2018-01-23 DIAGNOSIS — R197 Diarrhea, unspecified: Secondary | ICD-10-CM

## 2018-01-23 MED ORDER — NA SULFATE-K SULFATE-MG SULF 17.5-3.13-1.6 GM/177ML PO SOLN: ORAL | 0 refills | Status: DC

## 2018-01-23 NOTE — Patient Instructions (Signed)
If you are age 75 or older, your body mass index should be between 23-30. Your Body mass index is 29.21 kg/m. If this is out of the aforementioned range listed, please consider follow up with your Primary Care Provider.  If you are age 46 or younger, your body mass index should be between 19-25. Your Body mass index is 29.21 kg/m. If this is out of the aformentioned range listed, please consider follow up with your Primary Care Provider.   You have been scheduled for a colonoscopy. Please follow written instructions given to you at your visit today.  Please pick up your prep supplies at the pharmacy within the next 1-3 days. If you use inhalers (even only as needed), please bring them with you on the day of your procedure. Your physician has requested that you go to www.startemmi.com and enter the access code given to you at your visit today. This web site gives a general overview about your procedure. However, you should still follow specific instructions given to you by our office regarding your preparation for the procedure.  We have sent the following medications to your pharmacy for you to pick up at your convenience: Suprep  Thank you for choosing me and [facility].   Tye Savoy, NP

## 2018-01-23 NOTE — Progress Notes (Signed)
Reviewed and agree with management plan.  Quin Mathenia T. Ely Ballen, MD FACG 
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
## 2018-01-24 ENCOUNTER — Encounter: Payer: Self-pay | Admitting: Gastroenterology

## 2018-01-30 ENCOUNTER — Other Ambulatory Visit: Payer: Self-pay

## 2018-01-30 ENCOUNTER — Encounter: Payer: Self-pay | Admitting: Gastroenterology

## 2018-01-30 ENCOUNTER — Ambulatory Visit (AMBULATORY_SURGERY_CENTER): Payer: Medicare Other | Admitting: Gastroenterology

## 2018-01-30 VITALS — BP 156/82 | HR 59 | Temp 98.9°F | Resp 12 | Ht 70.0 in | Wt 203.0 lb

## 2018-01-30 DIAGNOSIS — Z86010 Personal history of colonic polyps: Secondary | ICD-10-CM

## 2018-01-30 DIAGNOSIS — R197 Diarrhea, unspecified: Secondary | ICD-10-CM

## 2018-01-30 MED ORDER — SODIUM CHLORIDE 0.9 % IV SOLN: 500.0000 mL | Freq: Once | INTRAVENOUS | Status: DC

## 2018-01-30 NOTE — Patient Instructions (Signed)
YOU HAD AN ENDOSCOPIC PROCEDURE TODAY AT THE [facility]:   Refer to the procedure report that was given to you for any specific questions about what was found during the examination.  If the procedure report does not answer your questions, please call your gastroenterologist to clarify.  If you requested that your care partner not be given the details of your procedure findings, then the procedure report has been included in a sealed envelope for you to review at your convenience later.  YOU SHOULD EXPECT: Some feelings of bloating in the abdomen. Passage of more gas than usual.  Walking can help get rid of the air that was put into your GI tract during the procedure and reduce the bloating. If you had a lower endoscopy (such as a colonoscopy or flexible sigmoidoscopy) you may notice spotting of blood in your stool or on the toilet paper. If you underwent a bowel prep for your procedure, you may not have a normal bowel movement for a few days.  Please Note:  You might notice some irritation and congestion in your nose or some drainage.  This is from the oxygen used during your procedure.  There is no need for concern and it should clear up in a day or so.  SYMPTOMS TO REPORT IMMEDIATELY:   Following lower endoscopy (colonoscopy or flexible sigmoidoscopy):  Excessive amounts of blood in the stool  Significant tenderness or worsening of abdominal pains  Swelling of the abdomen that is new, acute  Fever of 100F or higher  For urgent or emergent issues, a gastroenterologist can be reached at any hour by calling [phone].   DIET:  We do recommend a small meal at first, but then you may proceed to your regular diet.  Drink plenty of fluids but you should avoid alcoholic beverages for 24 hours.  ACTIVITY:  You should plan to take it easy for the rest of today and you should NOT DRIVE or use heavy machinery until tomorrow (because of the sedation medicines used during the test).     FOLLOW UP: Our staff will call the number listed on your records the next business day following your procedure to check on you and address any questions or concerns that you may have regarding the information given to you following your procedure. If we do not reach you, we will leave a message.  However, if you are feeling well and you are not experiencing any problems, there is no need to return our call.  We will assume that you have returned to your regular daily activities without incident.  If any biopsies were taken you will be contacted by phone or by letter within the next 1-3 weeks.  Please call us at [phone] if you have not heard about the biopsies in 3 weeks.    SIGNATURES/CONFIDENTIALITY: You and/or your care partner have signed paperwork which will be entered into your electronic medical record.  These signatures attest to the fact that that the information above on your After Visit Summary has been reviewed and is understood.  Full responsibility of the confidentiality of this discharge information lies with you and/or your care-partner. 

## 2018-01-30 NOTE — Progress Notes (Signed)
To PACU, VSS. Report to [email or web address] 

## 2018-01-30 NOTE — Progress Notes (Signed)
Called to room to assist during endoscopic procedure.  Patient ID and intended procedure confirmed with present staff. Received instructions for my participation in the procedure from the performing physician.  

## 2018-01-30 NOTE — Progress Notes (Signed)
Left wrist has titanium.  Placed Sept 1, 2018. maw

## 2018-01-30 NOTE — Op Note (Signed)
[name] Name: Lee Compton Procedure Date: 01/30/2018 11:20 AM MRN: [id number] Endoscopist: Ladene Artist , MD Age: 77 Referring MD:  Date of Birth: 1941/06/26 Gender: Male Account #: [id number] Procedure:                Colonoscopy Indications:              Clinically significant diarrhea of unexplained                            origin. Personal history of adenomatous colon                            polyps. Medicines:                Monitored Anesthesia Care Procedure:                Pre-Anesthesia Assessment:                           - Prior to the procedure, a History and Physical                            was performed, and patient medications and                            allergies were reviewed. The patient's tolerance of                            previous anesthesia was also reviewed. The risks                            and benefits of the procedure and the sedation                            options and risks were discussed with the patient.                            All questions were answered, and informed consent                            was obtained. Prior Anticoagulants: The patient has                            taken no previous anticoagulant or antiplatelet                            agents. ASA Grade Assessment: II - A patient with                            mild systemic disease. After reviewing the risks                            and benefits, the patient was deemed in  satisfactory condition to undergo the procedure.                           After obtaining informed consent, the colonoscope                            was passed under direct vision. Throughout the                            procedure, the patient's blood pressure, pulse, and                            oxygen saturations were monitored continuously. The                            Colonoscope was introduced through the anus and                advanced to the the terminal ileum, with                            identification of the appendiceal orifice and IC                            valve. The terminal ileum, ileocecal valve,                            appendiceal orifice, and rectum were photographed.                            The quality of the bowel preparation was good. The                            colonoscopy was performed without difficulty. The                            patient tolerated the procedure well. Scope In: 11:27:29 AM Scope Out: 11:45:57 AM Scope Withdrawal Time: 0 hours 11 minutes 56 seconds  Total Procedure Duration: 0 hours 18 minutes 28 seconds  Findings:                 The perianal and digital rectal examinations were                            normal.                           The terminal ileum appeared normal.                           Internal hemorrhoids were found during                            retroflexion. The hemorrhoids were medium-sized and                            Grade I (  internal hemorrhoids that do not prolapse).                           The exam was otherwise without abnormality on                            direct and retroflexion views. Random biopsies                            obtained throughout the colon. Complications:            No immediate complications. Estimated blood loss:                            None. Estimated Blood Loss:     Estimated blood loss: none. Impression:               - The examined portion of the ileum was normal.                           - Internal hemorrhoids.                           - The examination was otherwise normal on direct                            and retroflexion views. Random biopsies obtained. Recommendation:           - Repeat colonoscopy in 5 years for surveillance.                           - Patient has a contact number available for                            emergencies. The signs and symptoms of potential                             delayed complications were discussed with the                            patient. Return to normal activities tomorrow.                            Written discharge instructions were provided to the                            patient.                           - Resume previous diet.                           - Continue present medications.                           - Await pathology results.                           -  If pathology result is normal trial of Colestipol                            bid and Imodium bid prn Ladene Artist, MD 01/30/2018 11:50:35 AM This report has been signed electronically.

## 2018-01-31 ENCOUNTER — Telehealth: Payer: Self-pay | Admitting: *Deleted

## 2018-01-31 NOTE — Telephone Encounter (Signed)
  Follow up Call-  Call back number 01/30/2018  Post procedure Call Back phone  # [phone] cell  Permission to leave phone message Yes  Some recent data might be hidden     Patient questions:  Do you have a fever, pain , or abdominal swelling? No. Pain Score  0 *  Have you tolerated food without any problems? Yes.    Have you been able to return to your normal activities? Yes.    Do you have any questions about your discharge instructions: Diet   No. Medications  No. Follow up visit  No.  Do you have questions or concerns about your Care? No.  Actions: * If pain score is 4 or above: No action needed, pain <4.

## 2018-02-13 ENCOUNTER — Other Ambulatory Visit: Payer: Self-pay

## 2018-02-13 MED ORDER — BUDESONIDE 3 MG PO CPEP: 9.0000 mg | ORAL_CAPSULE | Freq: Every day | ORAL | 1 refills | Status: DC

## 2018-03-17 ENCOUNTER — Telehealth: Payer: Self-pay | Admitting: Gastroenterology

## 2018-03-17 NOTE — Telephone Encounter (Signed)
Mailbox is full. Unable to leave a message.  

## 2018-03-17 NOTE — Telephone Encounter (Signed)
Patient was to take budesonide 9 mg for 2 months then call with an update to begin a taper.

## 2018-03-18 NOTE — Telephone Encounter (Signed)
Attempted to call again. Mailbox is full. 

## 2018-03-25 NOTE — Telephone Encounter (Signed)
No return calls.

## 2018-05-26 ENCOUNTER — Other Ambulatory Visit: Payer: Self-pay | Admitting: Internal Medicine

## 2018-05-26 DIAGNOSIS — R7989 Other specified abnormal findings of blood chemistry: Secondary | ICD-10-CM

## 2018-05-26 DIAGNOSIS — R945 Abnormal results of liver function studies: Secondary | ICD-10-CM

## 2018-05-26 DIAGNOSIS — R109 Unspecified abdominal pain: Secondary | ICD-10-CM

## 2018-06-11 ENCOUNTER — Ambulatory Visit
Admission: RE | Admit: 2018-06-11 | Discharge: 2018-06-11 | Disposition: A | Discharge status: Home / Self Care | Payer: Medicare Other | Source: Ambulatory Visit | Attending: Internal Medicine | Admitting: Internal Medicine

## 2018-06-11 DIAGNOSIS — R945 Abnormal results of liver function studies: Secondary | ICD-10-CM

## 2018-06-11 DIAGNOSIS — R7989 Other specified abnormal findings of blood chemistry: Secondary | ICD-10-CM

## 2018-06-11 DIAGNOSIS — R109 Unspecified abdominal pain: Secondary | ICD-10-CM

## 2018-06-11 MED ORDER — IOPAMIDOL (ISOVUE-300) INJECTION 61%
100.0000 mL | Freq: Once | INTRAVENOUS | Status: AC | PRN
  Administered 2018-06-11: 100 mL via INTRAVENOUS

## 2018-06-16 ENCOUNTER — Other Ambulatory Visit: Payer: Self-pay | Admitting: Internal Medicine

## 2018-06-16 DIAGNOSIS — E278 Other specified disorders of adrenal gland: Secondary | ICD-10-CM

## 2018-06-22 ENCOUNTER — Ambulatory Visit
Admission: RE | Admit: 2018-06-22 | Discharge: 2018-06-22 | Disposition: A | Discharge status: Home / Self Care | Payer: Medicare Other | Source: Ambulatory Visit | Attending: Internal Medicine | Admitting: Internal Medicine

## 2018-06-22 DIAGNOSIS — E278 Other specified disorders of adrenal gland: Secondary | ICD-10-CM

## 2018-06-22 MED ORDER — GADOBENATE DIMEGLUMINE 529 MG/ML IV SOLN
19.0000 mL | Freq: Once | INTRAVENOUS | Status: AC | PRN
  Administered 2018-06-22: 19 mL via INTRAVENOUS

## 2019-04-28 ENCOUNTER — Other Ambulatory Visit: Payer: Self-pay

## 2019-04-28 ENCOUNTER — Encounter (HOSPITAL_COMMUNITY): Payer: Self-pay | Admitting: Emergency Medicine

## 2019-04-28 ENCOUNTER — Ambulatory Visit (HOSPITAL_COMMUNITY)
Admission: EM | Admit: 2019-04-28 | Discharge: 2019-04-28 | Disposition: A | Discharge status: Home / Self Care | Payer: Medicare Other | Attending: Family Medicine | Admitting: Family Medicine

## 2019-04-28 DIAGNOSIS — M25562 Pain in left knee: Secondary | ICD-10-CM

## 2019-04-28 MED ORDER — METHYLPREDNISOLONE ACETATE 40 MG/ML IJ SUSP
INTRAMUSCULAR | Status: AC
  Filled 2019-04-28: qty 1

## 2019-04-28 MED ORDER — POVIDONE-IODINE 10 % EX SOLN
CUTANEOUS | Status: AC
  Filled 2019-04-28: qty 118

## 2019-04-28 MED ORDER — METHYLPREDNISOLONE ACETATE 40 MG/ML IJ SUSP
40.0000 mg | Freq: Once | INTRAMUSCULAR | Status: AC
  Administered 2019-04-28: 40 mg via INTRA_ARTICULAR

## 2019-04-28 NOTE — ED Triage Notes (Signed)
Pt reports left knee pain that started three weeks ago.  He states he has had swelling that will go all the way down to his ankle.  Pt states nothing relieves it except for rest.

## 2019-04-28 NOTE — Discharge Instructions (Signed)
We drained some fluid and gave you a knee injection today Occasionally you may have a steroid flare' of it become red and warm before resolving.  If not resolving within 24 hours and having decreased ability to move knee really hot to touch and red or fevers please be seen immediately.

## 2019-04-28 NOTE — ED Provider Notes (Addendum)
Lee Compton    CSN: [id number] Arrival date & time: 04/28/19  1453      History   Chief Complaint Chief Complaint  Patient presents with  . Knee Pain    left    HPI Lee Compton is a 78 y.o. male history of hypertension, gout, GERD, asthma, presenting today for evaluation of knee pain.  Patient has had knee pain and swelling for the past 3 weeks.  He denies any specific injury or inciting incident.  Denies increase in activity.  States that he is typically an active person and this is been debilitating him.  States that he will get shooting pains occasionally that causes like to cramp up.  Any type of bending action causes pain.  Denies history of issues with his knee.  Does have history of gout, but states that he typically gets symptoms in his great toe, but has not had flare in a while as he is been on medicine from his PCP.  Denies calf pain or calf swelling.  HPI  Past Medical History:  Diagnosis Date  . Allergy   . Anxiety   . Asthma   . Cataract    bilateral cateracts  . Depression   . GERD (gastroesophageal reflux disease)   . Gout   . Hypercholesterolemia   . Hypertension     Patient Active Problem List   Diagnosis Date Noted  . Left wrist fracture 04/27/2017    Past Surgical History:  Procedure Laterality Date  . [place]  . COLONOSCOPY    . NASAL SINUS SURGERY  1980's, 1998  . OPEN REDUCTION INTERNAL FIXATION (ORIF) DISTAL RADIAL FRACTURE Left 04/27/2017   Procedure: IRRIGATION AND DEBRIDEMENT WITH OPEN REDUCTION INTERNAL FIXATION (ORIF) DISTAL RADIAL FRACTURE;  Surgeon: Roseanne Kaufman, MD;  Location: [facility];  Service: Orthopedics;  Laterality: Left;  . ROTATOR CUFF REPAIR     right shoulder       Home Medications    Prior to Admission medications   Medication Sig Start Date End Date Taking? Authorizing Provider  aspirin EC 81 MG tablet Take 81 mg by mouth daily.   Yes [provider]  colchicine 0.6 MG tablet Take 0.3  mg by mouth daily.   Yes [provider]  hydrochlorothiazide (HYDRODIURIL) 25 MG tablet Take 25 mg by mouth daily.   Yes [provider]  losartan (COZAAR) 25 MG tablet Take 25 mg by mouth daily.   Yes [provider]  venlafaxine XR (EFFEXOR-XR) 75 MG 24 hr capsule Take 75 mg by mouth daily with breakfast.   Yes [provider]  budesonide (ENTOCORT EC) 3 MG 24 hr capsule Take 3 capsules (9 mg total) by mouth daily. 02/13/18   Ladene Artist, MD  Fluticasone-Salmeterol (ADVAIR) 100-50 MCG/DOSE AEPB Inhale 1 puff into the lungs daily.    [provider]  omega-3 acid ethyl esters (LOVAZA) 1 g capsule Take 1 g by mouth daily.    [provider]  Red Yeast Rice Extract (RED YEAST RICE PO) Take 1 capsule by mouth daily. 1200mg      [provider]    Family History Family History  Problem Relation Age of Onset  . Alzheimer's disease Mother   . Colon cancer Neg Hx   . Pancreatic cancer Neg Hx   . Stomach cancer Neg Hx   . Esophageal cancer Neg Hx   . Prostate cancer Neg Hx   . Rectal cancer Neg Hx   .  Liver cancer Neg Hx     Social History Social History   Tobacco Use  . Smoking status: Former Smoker    Types: Cigarettes    Quit date: 07/27/1969    Years since quitting: 49.7  . Smokeless tobacco: Former Systems developer    Types: Chew  . Tobacco comment: many years that pt quit chew  Substance Use Topics  . Alcohol use: Yes    Alcohol/week: 3.0 - 4.0 standard drinks    Types: 3 - 4 Cans of beer per week  . Drug use: No     Allergies   Patient has no known allergies.   Review of Systems Review of Systems  Constitutional: Negative for fatigue and fever.  Eyes: Negative for redness, itching and visual disturbance.  Respiratory: Negative for shortness of breath.   Cardiovascular: Negative for chest pain and leg swelling.  Gastrointestinal: Negative for nausea and vomiting.  Musculoskeletal: Positive for arthralgias and  gait problem. Negative for myalgias.  Skin: Negative for color change, rash and wound.  Neurological: Negative for dizziness, syncope, weakness, light-headedness and headaches.     Physical Exam Triage Vital Signs ED Triage Vitals [04/28/19 1544]  Enc Vitals Group     BP      Pulse      Resp      Temp      Temp src      SpO2      Weight      Height      Head Circumference      Peak Flow      Pain Score 9     Pain Loc      Pain Edu?      Excl. in [place]?    No data found.  Updated Vital Signs BP (!) 148/99 (BP Location: Left Arm)   Pulse 83   Temp 98.7 F (37.1 C) (Oral)   Resp 12   SpO2 98%   Visual Acuity Right Eye Distance:   Left Eye Distance:   Bilateral Distance:    Right Eye Near:   Left Eye Near:    Bilateral Near:     Physical Exam Vitals signs and nursing note reviewed.  Constitutional:      Appearance: He is well-developed.     Comments: No acute distress  HENT:     Head: Normocephalic and atraumatic.     Nose: Nose normal.  Eyes:     Conjunctiva/sclera: Conjunctivae normal.  Neck:     Musculoskeletal: Neck supple.  Cardiovascular:     Rate and Rhythm: Normal rate.  Pulmonary:     Effort: Pulmonary effort is normal. No respiratory distress.  Abdominal:     General: There is no distension.  Musculoskeletal: Normal range of motion.     Comments: Left knee: Moderate swelling compared to right, no overlying erythema or increased warmth, appears to have suprapatellar effusion, tenderness to palpation over suprapatellar area and more laterally.  Nontender directly over patella or medial or lateral joint lines.  Limited range of motion due to pain and swelling No laxity appreciated with special tests  Skin:    General: Skin is warm and dry.  Neurological:     Mental Status: He is alert and oriented to person, place, and time.      UC Treatments / Results  Labs (all labs ordered are listed, but only abnormal results are displayed) Labs  Reviewed - No data to display  EKG   Radiology No results found.  Procedures Join Aspiration/Injection  Date/Time: 04/28/2019 4:52 PM Performed by: Augusta Mirkin, Elesa Hacker, PA-C Authorized by: Raylene Everts, MD   Consent:    Consent obtained:  Verbal   Consent given by:  Patient   Risks discussed:  Bleeding, infection and pain   Alternatives discussed:  Alternative treatment Location:    Location:  Knee   Knee:  L knee Anesthesia (see MAR for exact dosages):    Anesthesia method:  Topical application   Topical anesthesia: lidocaine wheal 1% Procedure details:    Needle gauge:  18 G   Ultrasound guidance: no     Approach: superior/lateral.   Aspirate amount:  15   Aspirate characteristics:  Clear, yellow and blood-tinged   Steroid injected: yes (40 mg Depomedrol)     Specimen collected: no   Post-procedure details:    Dressing:  Adhesive bandage   Patient tolerance of procedure:  Tolerated well, no immediate complications   (including critical care time)  Medications Ordered in UC Medications  methylPREDNISolone acetate (DEPO-MEDROL) injection 40 mg (40 mg Intra-articular Given 04/28/19 1632)  povidone-iodine (BETADINE) 10 % external solution (has no administration in time range)  methylPREDNISolone acetate (DEPO-MEDROL) 40 MG/ML injection (has no administration in time range)    Initial Impression / Assessment and Plan / UC Course  I have reviewed the triage vital signs and the nursing notes.  Pertinent labs & imaging results that were available during my care of the patient were reviewed by me and considered in my medical decision making (see chart for details).     Knee pain x3 weeks, no mechanism of injury, do not suspect acute bony abnormality.  Likely more inflammatory.  Not suggestive of gout or septic joint at this time..  Have effusion, attempted aspiration, approximately 15 cc of clear synovial fluid obtained, slightly tinged with blood, 40 mg/mL mixed with  approximately 4 cc of Xylocaine 1% injected.  Wrapped with Ace wrap.  Discussed possible steroid flare.  Advised to follow-up if developing any sign of infection or symptoms not resolving.  Given no significant fluid drained, possible synovitis.  Discussed strict return precautions. Patient verbalized understanding and is agreeable with plan.  Final Clinical Impressions(s) / UC Diagnoses   Final diagnoses:  Acute pain of left knee     Discharge Instructions     We drained some fluid and gave you a knee injection today Occasionally you may have a steroid flare' of it become red and warm before resolving.  If not resolving within 24 hours and having decreased ability to move knee really hot to touch and red or fevers please be seen immediately.     ED Prescriptions    None     Controlled Substance Prescriptions [place] Controlled Substance Registry consulted? Not Applicable   Janith Lima, PA-C 04/28/19 1720    Janith Lima, PA-C 04/28/19 1720

## 2020-02-08 IMAGING — CT CT ABD-PELV W/ CM
1 of 3 series · 12 of 32 positions shown, 17 images · IV contrast (APPLIED)
Comparison: None.

CLINICAL DATA: Elevated LFTs.  GERD.

EXAM:
CT ABDOMEN AND PELVIS WITH CONTRAST
TECHNIQUE: Multidetector CT imaging of the abdomen and pelvis was performed
using the standard protocol following bolus administration of
intravenous contrast.
CONTRAST:  100mL CBLJD3-WJJ IOPAMIDOL (CBLJD3-WJJ) INJECTION 61%
Creatinine was obtained on site at [HOSPITAL] at [HOSPITAL].
Results: Creatinine 0.9 mg/dL.

[Series 2: abd/pelvis w/cm · axial · 0.90mm/px · z∈[-440,-35]mm · 12 of 95 slices shown, 17 images]
[im 7/95  soft-tissue]
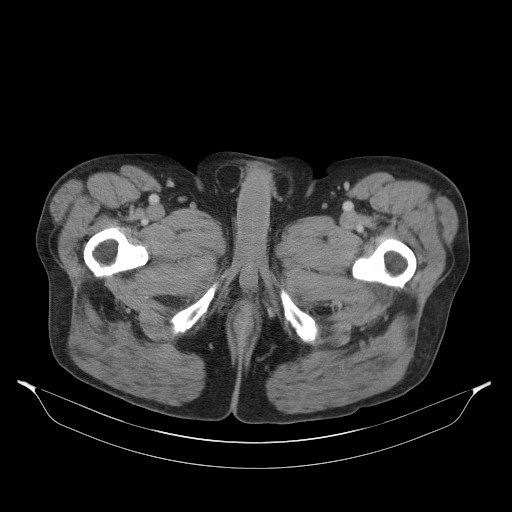
[im 7/95  bone]
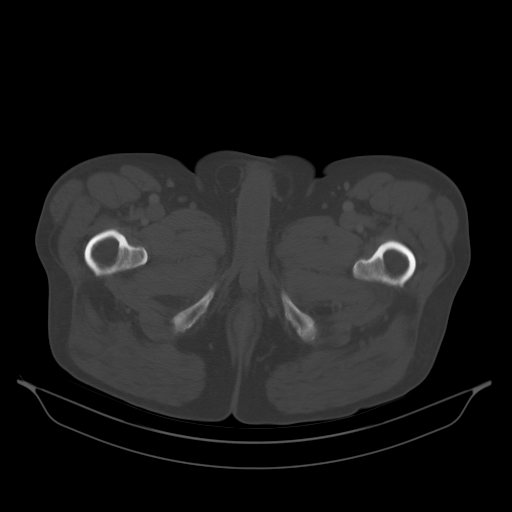
[im 13/95  soft-tissue]
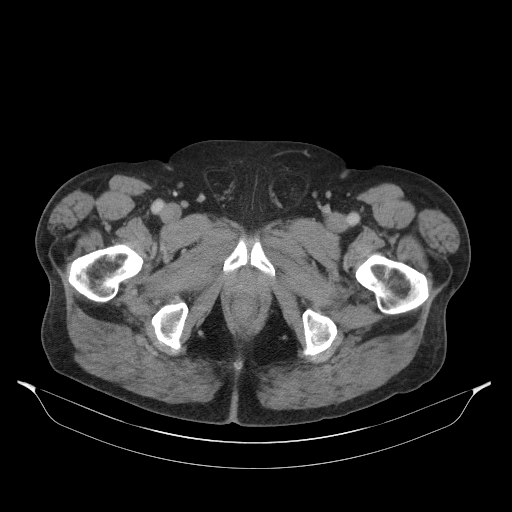
[im 26/95  soft-tissue]
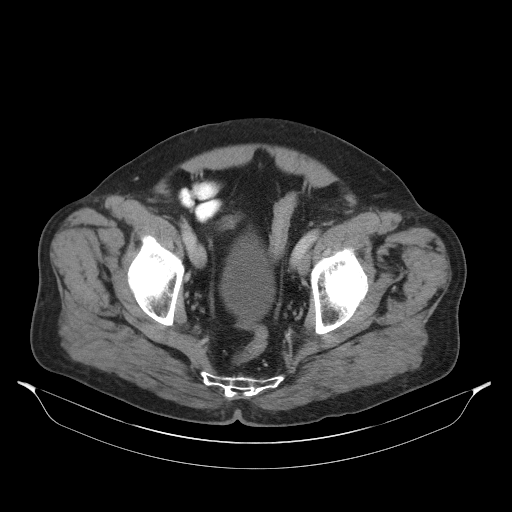
[im 32/95  soft-tissue]
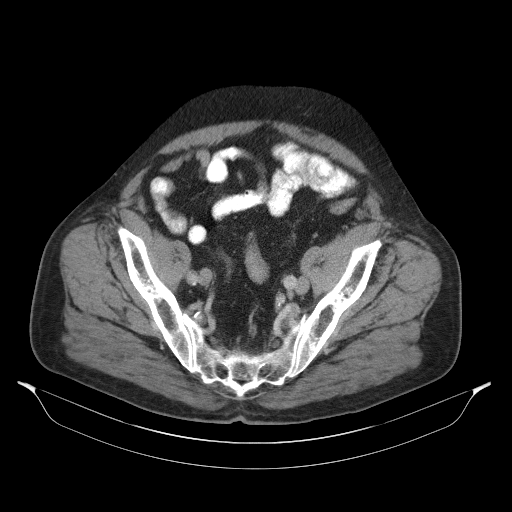
[im 38/95  soft-tissue]
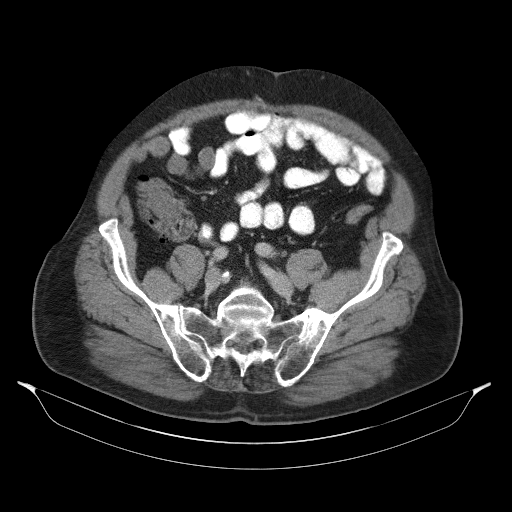
[im 51/95  soft-tissue]
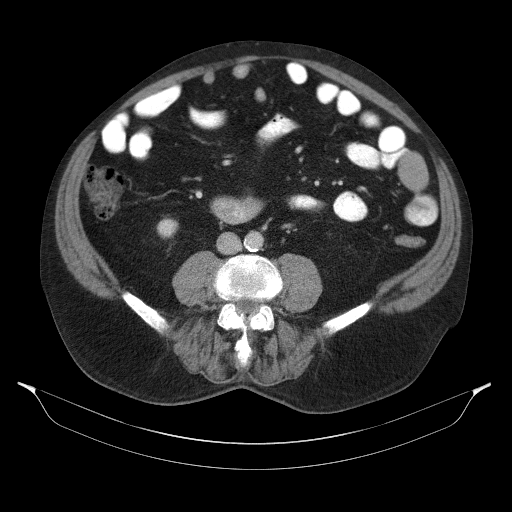
[im 57/95  soft-tissue]
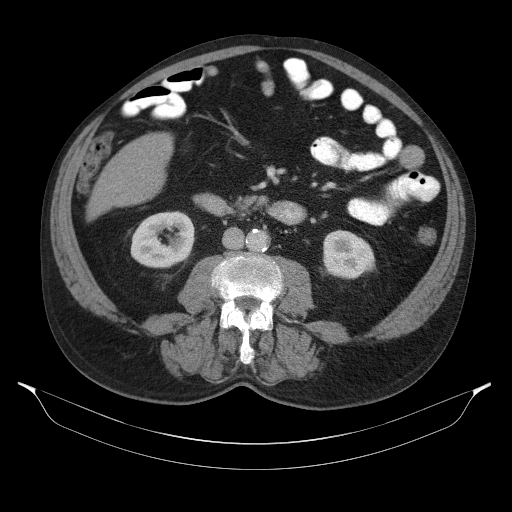
[im 63/95  soft-tissue]
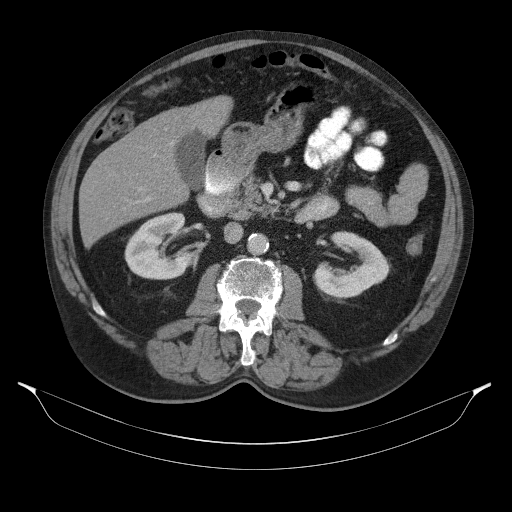
[im 69/95  soft-tissue]
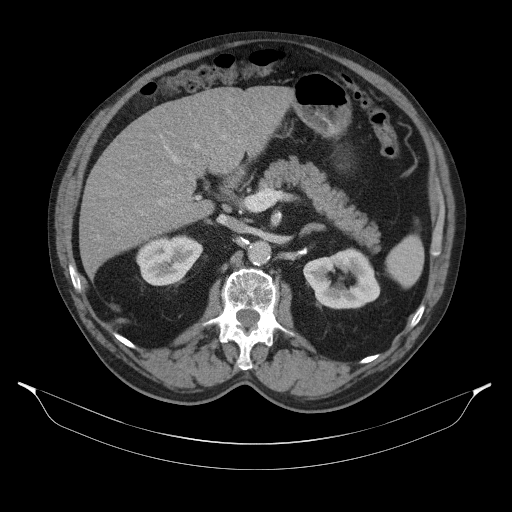
[im 69/95  lung]
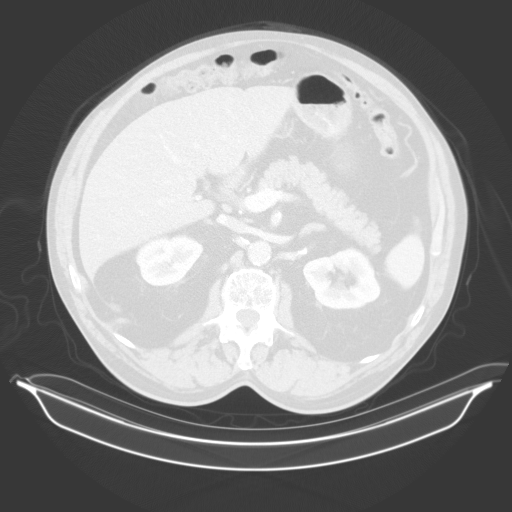
[im 69/95  bone]
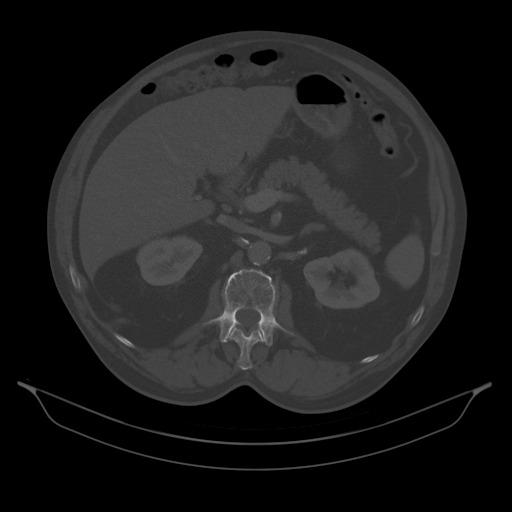
[im 76/95  lung]
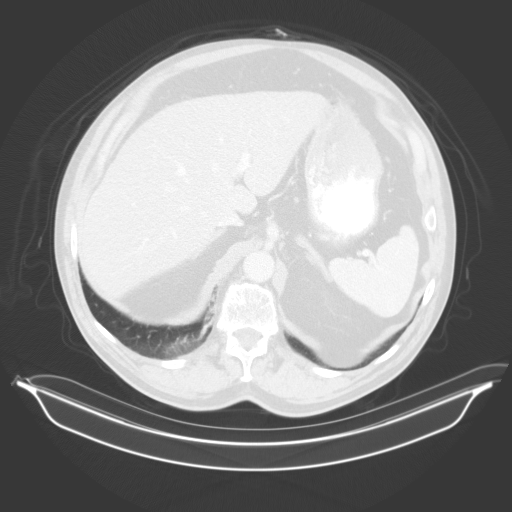
[im 82/95  soft-tissue]
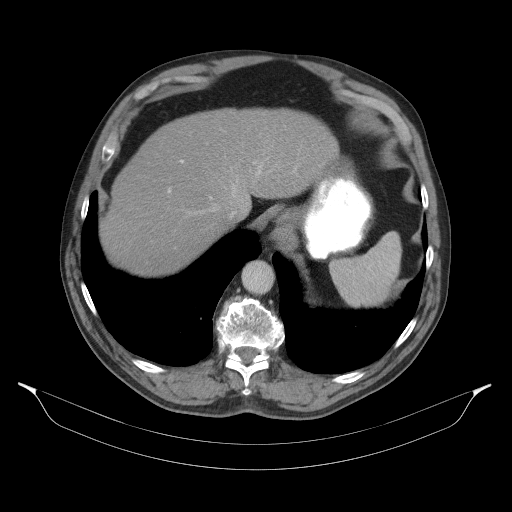
[im 82/95  lung]
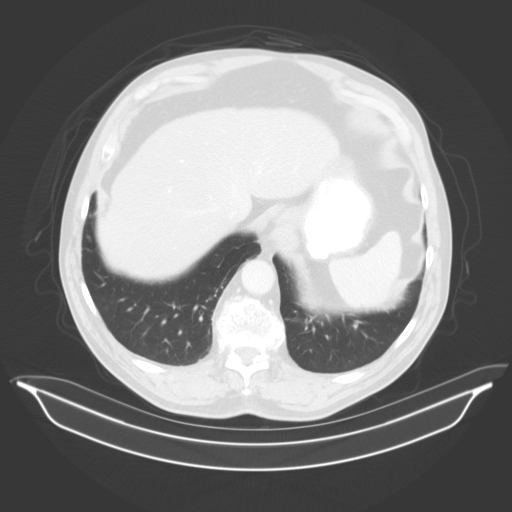
[im 88/95  soft-tissue]
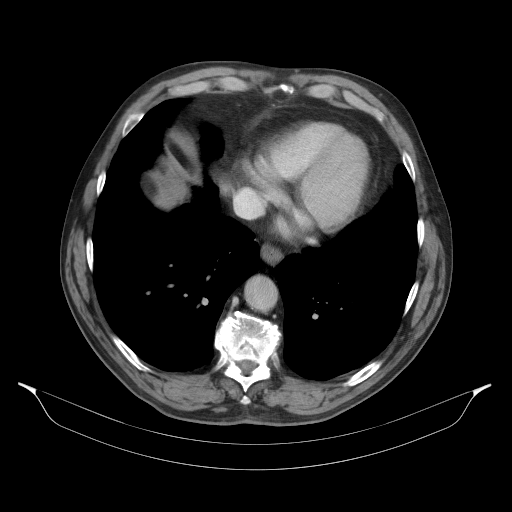
[im 88/95  lung]
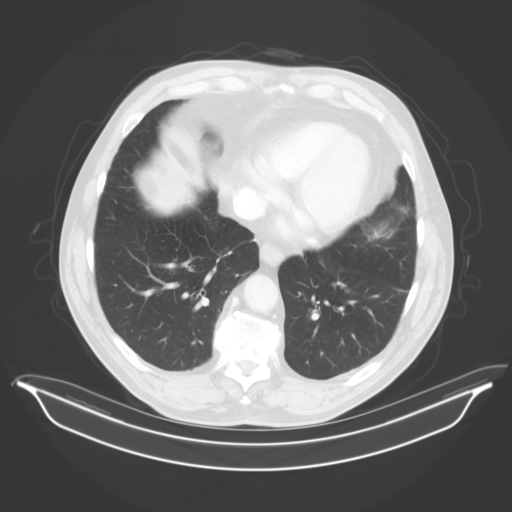

[12 of 32 positions shown; findings below may reference images not displayed]

FINDINGS: Lower chest: Limited visualization of the lower thorax demonstrates
minimal dependent subpleural ground-glass atelectasis within the
medial basilar aspect of the right lower lobe. No focal airspace
opacities. No pleural effusion.

Normal heart size.  No pericardial effusion.

Hepatobiliary: There is mild nodularity of the hepatic contour.
There is mild diffuse decreased attenuation of the hepatic
parenchyma on this postcontrast examination suggestive of hepatic
steatosis. There is a punctate (approximately 2 mm) granuloma within
the subcapsular aspect the right lobe of the liver (image 24, series
2). No discrete worrisome hepatic lesions. Normal appearance of the
gallbladder given degree of distention. No radiopaque gallstones. No
intra or extrahepatic biliary ductal dilatation. No ascites.

Pancreas: Normal appearance of the pancreas

Spleen: Normal appearance of the spleen.

Adrenals/Urinary Tract: There is symmetric enhancement and excretion
of the bilateral kidneys. Note is made of an approximately 1.3 cm
hypoattenuating (11 Hounsfield unit) right-sided renal cyst (image
14, series 6). There is a punctate (approximately 0.8 cm)
hypoattenuating left-sided renal lesion which is too small to
accurately characterize though favored to represent an additional
renal cyst. There is a minimal amount of grossly symmetric bilateral
perinephric stranding, presumably age and body habitus related. No
urinary obstruction. Normal appearance of the urinary bladder given
degree distention.

There is an approximately 1.8 x 1.3 cm hypoattenuating nodule within
the crux of the left adrenal gland (image 22, series 2) which is
incompletely characterized on present examination. Apparent linear
calcification within the lateral limb of the left adrenal gland may
represent the sequela of prior hemorrhage (image 8, series 6). No
discrete right-sided adrenal lesion.

Stomach/Bowel: Ingested enteric contrast extends to the level of the
distal small bowel. No evidence of enteric obstruction. Normal
appearance of the terminal ileum and appendix. No pneumoperitoneum,
pneumatosis or portal venous gas.

Vascular/Lymphatic: Moderate amount of irregular mixed calcified and
noncalcified atherosclerotic plaque with a normal caliber abdominal
aorta. The major branch vessels of the abdominal aorta appear patent
on this non CTA examination.

No bulky retroperitoneal, mesenteric, pelvic or inguinal
lymphadenopathy.

Reproductive: Normal appearance the prostate gland. No free fluid in
the pelvic cul-de-sac.

Other: Note is made of small bilateral mesenteric fat containing
inguinal hernias.

Musculoskeletal: No acute or aggressive osseous abnormalities.
Severe DDD of L4-L5 with disc space height loss, endplate
irregularity and sclerosis.
IMPRESSION: 1. Mild diffuse decreased attenuation of the hepatic parenchyma with
mild nodularity hepatic contour as could be seen in the setting
hepatic steatosis and early cirrhotic change. No discrete hepatic
lesions.
2. **An incidental finding of potential clinical significance has
been found. No made of an approximately 1.8 cm nodule within the
crux of the left adrenal gland, incompletely characterized on
present examination though in the absence of a known primary
malignancy is statistically representative of a lipid poor adenoma.
Further evaluation with nonemergent abdominal MRI could be performed
as indicated.**
3.  Aortic Atherosclerosis ([id number]).

## 2020-06-27 ENCOUNTER — Other Ambulatory Visit (HOSPITAL_COMMUNITY): Payer: Self-pay | Admitting: Internal Medicine

## 2020-06-27 DIAGNOSIS — R0600 Dyspnea, unspecified: Secondary | ICD-10-CM

## 2020-06-27 DIAGNOSIS — R0609 Other forms of dyspnea: Secondary | ICD-10-CM

## 2020-07-05 ENCOUNTER — Ambulatory Visit: Payer: Medicare Other | Admitting: Internal Medicine

## 2020-07-05 ENCOUNTER — Other Ambulatory Visit: Payer: Self-pay

## 2020-07-05 VITALS — BP 136/78 | HR 91 | Ht 70.0 in | Wt 218.0 lb

## 2020-07-05 DIAGNOSIS — E875 Hyperkalemia: Secondary | ICD-10-CM | POA: Insufficient documentation

## 2020-07-05 DIAGNOSIS — E785 Hyperlipidemia, unspecified: Secondary | ICD-10-CM | POA: Insufficient documentation

## 2020-07-05 DIAGNOSIS — I10 Essential (primary) hypertension: Secondary | ICD-10-CM | POA: Diagnosis not present

## 2020-07-05 DIAGNOSIS — R0600 Dyspnea, unspecified: Secondary | ICD-10-CM | POA: Diagnosis not present

## 2020-07-05 DIAGNOSIS — I700 Atherosclerosis of aorta: Secondary | ICD-10-CM | POA: Insufficient documentation

## 2020-07-05 DIAGNOSIS — I491 Atrial premature depolarization: Secondary | ICD-10-CM

## 2020-07-05 DIAGNOSIS — R0683 Snoring: Secondary | ICD-10-CM

## 2020-07-05 DIAGNOSIS — G4719 Other hypersomnia: Secondary | ICD-10-CM

## 2020-07-05 DIAGNOSIS — I493 Ventricular premature depolarization: Secondary | ICD-10-CM | POA: Insufficient documentation

## 2020-07-05 DIAGNOSIS — R0609 Other forms of dyspnea: Secondary | ICD-10-CM | POA: Insufficient documentation

## 2020-07-05 MED ORDER — AMLODIPINE BESYLATE 5 MG PO TABS: 5.0000 mg | ORAL_TABLET | Freq: Every day | ORAL | 3 refills | Status: DC

## 2020-07-05 NOTE — Patient Instructions (Signed)
Medication Instructions:  Your physician has recommended you make the following change in your medication:   1. STOP: red yeast rice  2. INCREASE: amlodipine to 5 mg once a day  *If you need a refill on your cardiac medications before your next appointment, please call your pharmacy*   Lab Work: TODAY: BMET, BNP  If you have labs (blood work) drawn today and your tests are completely normal, you will receive your results only by: Marland Kitchen MyChart Message (if you have MyChart) OR . A paper copy in the mail If you have any lab test that is abnormal or we need to change your treatment, we will call you to review the results.   Testing/Procedures: Your physician has requested that you have an echocardiogram. Echocardiography is a painless test that uses sound waves to create images of your heart. It provides your doctor with information about the size and shape of your heart and how well your heart's chambers and valves are working. This procedure takes approximately one hour. There are no restrictions for this procedure.  Your physician has recommended that you have a sleep study. This test records several body functions during sleep, including: brain activity, eye movement, oxygen and carbon dioxide blood levels, heart rate and rhythm, breathing rate and rhythm, the flow of air through your mouth and nose, snoring, body muscle movements, and chest and belly movement.  Follow-Up: At [facility], you and your health needs are our priority.  As part of our continuing mission to provide you with exceptional heart care, we have created designated Provider Care Teams.  These Care Teams include your primary Cardiologist (physician) and Advanced Practice Providers (APPs -  Physician Assistants and Nurse Practitioners) who all work together to provide you with the care you need, when you need it.  We recommend signing up for the patient portal called "MyChart".  Sign up information is provided on this  After Visit Summary.  MyChart is used to connect with patients for Virtual Visits (Telemedicine).  Patients are able to view lab/test results, encounter notes, upcoming appointments, etc.  Non-urgent messages can be sent to your provider as well.   To learn more about what you can do with MyChart, go to [email or web address].    Your next appointment:   2-3 month(s)  The format for your next appointment:   In Person  Provider:   Rudean Haskell, MD   Other Instructions None

## 2020-07-05 NOTE — Progress Notes (Signed)
Cardiology Office Note:    Date:  07/05/2020   ID:  Lee Compton, DOB 07-22-1941, MRN [id number]  PCP:  Shon Baton, MD  [facility] Cardiologist:  No primary care provider on file.  [facility] Electrophysiologist:  None   CC: DOE Consulted for the evaluation of DOE at the [employer] of Shon Baton, MD  History of Present Illness:    Lee Compton is a 79 y.o. male with a hx of aortic atherosclerosis and HLD, HTN, PVCs, PACs who presents for evaluation.  Patient denies prior ABIs or PAD intervention (PAD on patients problem list).  Patient notes that he has notable bendopnea. Notes that he gets out of breath with minimal exertion.  Has had this since before COVID 19 and has progressively getting worse.  Notes that he mild activity gives him shortness of breath.  Denies chest pain, chest pressure, chest tightness, chest stinging.  No arm pain, jaw pain, throat pain, back pain.  No Syncope or near syncope. No PND or orthopnea.  Notes weight gain but over years, no abdominal swelling or leg swelling.    Ambulatory BP 150-160/80-90 per primary Patient notes that he checks his BP at night, it improves.  Past Medical History:  Diagnosis Date  . Abdominal pain   . Allergy   . Anxiety   . Arrhythmia   . Asthma   . Atherosclerosis of aorta ([place])   . Cataract    bilateral cateracts  . Collagenous colitis   . Constipation   . Cough   . Depression   . Diarrhea   . 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 Dyspnea on exertion   . Edema   . Effusion of left knee joint   . Fatty liver   . Gastro-esophageal reflux disease without esophagitis   . GERD (gastroesophageal reflux disease)   . Gout   . Herpes zoster   . Hypercholesterolemia   . Hyperkalemia   . Hypertension   . Impaired fasting glucose   . Impingement syndrome of right shoulder   . Insomnia   . Male erectile dysfunction, unspecified   . Nocturia   . Obesity   . 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  Other specified abnormal findings of blood chemistry   . Other specified disorders of bone density and structure, unspecified site   . Pain in left knee   . Pain in right ankle and joints of right foot   . Peripheral vascular disease, unspecified ([place])   . Rash and other nonspecific skin eruption   . Shingles   . Skin infection   . SOB (shortness of breath)   . Tinnitus   . Unspecified fracture of unspecified wrist and hand, sequela   . Unspecified osteoarthritis, unspecified site   . Ventricular premature depolarization    Past Surgical History:  Procedure Laterality Date  . [place]  . CATARACT EXTRACTION    . COLONOSCOPY    . LUMBAR LAMINECTOMY    . NASAL SINUS SURGERY  1980's, 1998  . OPEN REDUCTION INTERNAL FIXATION (ORIF) DISTAL RADIAL FRACTURE Left 04/27/2017   Procedure: IRRIGATION AND DEBRIDEMENT WITH OPEN REDUCTION INTERNAL FIXATION (ORIF) DISTAL RADIAL FRACTURE;  Surgeon: Roseanne Kaufman, MD;  Location: [place];  Service: Orthopedics;  Laterality: Left;  . ROTATOR CUFF REPAIR     right shoulder  . sinus surgeries     Current Medications: Current Meds  Medication Sig  . amLODipine (NORVASC) 2.5 MG  tablet Take 2.5 mg by mouth daily.  Marland Kitchen aspirin EC 81 MG tablet Take 81 mg by mouth daily.  . budesonide (ENTOCORT EC) 3 MG 24 hr capsule Take 3 capsules (9 mg total) by mouth daily.  . CHLOROTHIAZIDE PO Take 25 mg by mouth.  . colchicine 0.6 MG tablet Take 0.3 mg by mouth daily.  . diclofenac (VOLTAREN) 75 MG EC tablet Take 75 mg by mouth 2 (two) times daily.  . DULoxetine (CYMBALTA) 60 MG capsule Take 60 mg by mouth daily.  . Fluticasone-Salmeterol (ADVAIR) 100-50 MCG/DOSE AEPB Inhale 1 puff into the lungs daily.  Marland Kitchen gabapentin (NEURONTIN) 100 MG capsule Take 100 mg by mouth 3 (three) times daily.  . hydrochlorothiazide (HYDRODIURIL) 25 MG tablet Take 25 mg by mouth daily.  Marland Kitchen losartan (COZAAR) 25 MG tablet Take 25 mg by mouth daily.  . mupirocin ointment (BACTROBAN) 2  % 1 application 2 (two) times daily.  Marland Kitchen omega-3 acid ethyl esters (LOVAZA) 1 g capsule Take 1 g by mouth daily.  . Red Yeast Rice Extract (RED YEAST RICE PO) Take 1 capsule by mouth daily. 1200mg    . rosuvastatin (CRESTOR) 10 MG tablet Take 10 mg by mouth daily.  Marland Kitchen saccharomyces boulardii (FLORASTOR) 250 MG capsule Take 250 mg by mouth 2 (two) times daily.  . tadalafil (CIALIS) 5 MG tablet Take 5 mg by mouth daily as needed for erectile dysfunction.  . valACYclovir (VALTREX) 1000 MG tablet Take 1,000 mg by mouth 2 (two) times daily.  Marland Kitchen venlafaxine XR (EFFEXOR-XR) 75 MG 24 hr capsule Take 75 mg by mouth daily with breakfast.    Allergies:   Patient has no known allergies.   Social History   Socioeconomic History  . Marital status: Widowed    Spouse name: Not on file  . Number of children: Not on file  . Years of education: Not on file  . Highest education level: Not on file  Occupational History  . Not on file  Tobacco Use  . Smoking status: Former Smoker    Types: Cigarettes    Quit date: 07/27/1969    Years since quitting: 50.9  . Smokeless tobacco: Former Systems developer    Types: Chew  . Tobacco comment: many years that pt quit chew  Vaping Use  . Vaping Use: Never used  Substance and Sexual Activity  . Alcohol use: Yes    Alcohol/week: 3.0 - 4.0 standard drinks    Types: 3 - 4 Cans of beer per week  . Drug use: No  . Sexual activity: Not on file  Other Topics Concern  . Not on file  Social History Narrative  . Not on file   Social Determinants of Health   Financial Resource Strain:   . Difficulty of Paying Living Expenses: Not on file  Food Insecurity:   . Worried About Charity fundraiser in the Last Year: Not on file  . Ran Out of Food in the Last Year: Not on file  Transportation Needs:   . Lack of Transportation (Medical): Not on file  . Lack of Transportation (Non-Medical): Not on file  Physical Activity:   . Days of Exercise per Week: Not on file  . Minutes of  Exercise per Session: Not on file  Stress:   . Feeling of Stress : Not on file  Social Connections:   . Frequency of Communication with Friends and Family: Not on file  . Frequency of Social Gatherings with Friends and Family: Not on file  .  Attends Religious Services: Not on file  . Active Member of Clubs or Organizations: Not on file  . Attends Archivist Meetings: Not on file  . Marital Status: Not on file    Family History: The patient's family history includes Alzheimer's disease in his mother; CVA in his father; Diabetes Mellitus II in his daughter; Heart disease in his brother and father; Pancreatic cancer in his brother. There is no history of Colon cancer, Stomach cancer, Esophageal cancer, Prostate cancer, Rectal cancer, or Liver cancer.  ROS:   Please see the history of present illness.    Notes arm pain All other systems reviewed and are negative.  EKGs/Labs/Other Studies Reviewed:    The following studies were reviewed today:  EKG:  EKG is  ordered today.  The ekg ordered today demonstrates  1st EKG  SR rate 95 with occasional PVC 2nd EKG SR rate 91 with occasional PAC and PVC  Recent Labs: No results found for requested labs within last 8760 hours.  Recent Lipid Panel No results found for: CHOL, TRIG, HDL, CHOLHDL, VLDL, LDLCALC, LDLDIRECT  05/20/20 OSH Labs: Creatinine 1.0 K 5.4 AST/ALT 24/32 Cholesterol 133; LDL 52, HDL 42, Triglycerides 194 (non fasting)  Physical Exam:    VS:  BP 136/78   Pulse 91   Ht 5\' 10"  (1.778 m)   Wt 218 lb (98.9 kg)   SpO2 96%   BMI 31.28 kg/m     Wt Readings from Last 3 Encounters:  07/05/20 218 lb (98.9 kg)  01/30/18 203 lb (92.1 kg)  01/23/18 203 lb 9.6 oz (92.4 kg)    GEN: Well nourished, well developed in no acute distress HEENT: Normal NECK: No JVD; No carotid bruits LYMPHATICS: No lymphadenopathy CARDIAC: Occasional irregular beat, no murmurs, rubs, gallops RESPIRATORY:  Clear to auscultation without  rales, wheezing or rhonchi  ABDOMEN: Soft, non-tender, non-distended MUSCULOSKELETAL:  No edema; No deformity  SKIN: Warm and dry NEUROLOGIC:  Alert and oriented x 3 PSYCHIATRIC:  Normal affect   ASSESSMENT:    No diagnosis found. PLAN:    In order of problems listed above:  DOE - will start echocardiogram - will get BMP and BNP - based on this will decide on ischemic testing (CCTA +/- FFR) vs LHC - If new WMAs; Risks and benefits of cardiac catheterization have been discussed with the patient.  These include bleeding, infection, kidney damage, stroke, heart attack, death.  The patient understands these risks and is willing to proceed.  Essential Hypertension Hyperkalemia - ambulatory blood pressure elevated 150/80, will continue ambulatory BP monitoring (gave education) - continue home medications with the exception of increasing amlodipine to 5 mg (on losartan 25 mg, HCTZ 25 mg daily)  - will get labs (BMET) - OSA , STOPBANG 4; would recommend sleep referral (sleep study and follow up) - discussed diet (DASH/low sodium), and exercise/weight loss interventions  HLD Aortic Atherosclerosis - at LDL goal on rosuvastatin 10 mg - would defer red yeast rice extract  PVCs PACs - asymptomatic; will defer AV nodal therapy presently  2-3 months follow up unless new symptoms or abnormal test results warranting change in plan  Would be reasonable for Virtual Follow up Would be reasonable for APP Follow up   Shared Decision Making/Informed Consent      Medication Adjustments/Labs and Tests Ordered: Current medicines are reviewed at length with the patient today.  Concerns regarding medicines are outlined above.  No orders of the defined types were placed in this encounter.  No orders of the defined types were placed in this encounter.   There are no Patient Instructions on file for this visit.   Signed, Werner Lean, MD  07/05/2020 11:25 AM    [place]

## 2020-07-06 LAB — BASIC METABOLIC PANEL
BUN/Creatinine Ratio: 18 (ref 10–24)
BUN: 17 mg/dL (ref 8–27)
CO2: 24 mmol/L (ref 20–29)
Calcium: 9.9 mg/dL (ref 8.6–10.2)
Chloride: 101 mmol/L (ref 96–106)
Creatinine, Ser: 0.95 mg/dL (ref 0.76–1.27)
GFR calc Af Amer: 90 mL/min/{1.73_m2} (ref >59)
GFR calc non Af Amer: 77 mL/min/{1.73_m2} (ref >59)
Glucose: 107 mg/dL — ABNORMAL HIGH (ref 65–99)
Potassium: 4.3 mmol/L (ref 3.5–5.2)
Sodium: 141 mmol/L (ref 134–144)

## 2020-07-06 LAB — PRO B NATRIURETIC PEPTIDE: NT-Pro BNP: 50 pg/mL (ref 0–486)

## 2020-07-08 ENCOUNTER — Telehealth: Payer: Self-pay | Admitting: Internal Medicine

## 2020-07-08 NOTE — Telephone Encounter (Signed)
Patient is returning call.  °

## 2020-07-08 NOTE — Telephone Encounter (Signed)
The patient has been notified of the result and verbalized understanding.  All questions (if any) were answered. Wilma Flavin, RN 07/08/2020 2:56 PM

## 2020-07-08 NOTE — Telephone Encounter (Signed)
-----   Message from Werner Lean, MD sent at 07/06/2020  3:15 PM EST ----- Results: Hyperkalemia has resolved, no increase no BNP Plan: Will follow Echocardiogram to make decision on further testing  Werner Lean, MD

## 2020-07-08 NOTE — Telephone Encounter (Signed)
Patient returning call in regards to lab results.  

## 2020-07-08 NOTE — Telephone Encounter (Signed)
Left message to call back  

## 2020-07-19 ENCOUNTER — Other Ambulatory Visit: Payer: Self-pay

## 2020-07-19 ENCOUNTER — Ambulatory Visit (HOSPITAL_COMMUNITY): Payer: Medicare Other | Attending: Cardiology

## 2020-07-19 DIAGNOSIS — R0600 Dyspnea, unspecified: Secondary | ICD-10-CM | POA: Diagnosis not present

## 2020-07-19 DIAGNOSIS — R0609 Other forms of dyspnea: Secondary | ICD-10-CM

## 2020-07-19 LAB — ECHOCARDIOGRAM COMPLETE
Area-P 1/2: 2.54 cm2
S' Lateral: 2.9 cm

## 2020-10-06 ENCOUNTER — Other Ambulatory Visit: Payer: Self-pay

## 2020-10-06 ENCOUNTER — Encounter: Payer: Self-pay | Admitting: Internal Medicine

## 2020-10-06 ENCOUNTER — Ambulatory Visit: Payer: Medicare Other | Admitting: Internal Medicine

## 2020-10-06 VITALS — BP 122/62 | HR 74 | Ht 70.0 in | Wt 204.8 lb

## 2020-10-06 DIAGNOSIS — I491 Atrial premature depolarization: Secondary | ICD-10-CM

## 2020-10-06 DIAGNOSIS — I700 Atherosclerosis of aorta: Secondary | ICD-10-CM

## 2020-10-06 DIAGNOSIS — I493 Ventricular premature depolarization: Secondary | ICD-10-CM

## 2020-10-06 DIAGNOSIS — I10 Essential (primary) hypertension: Secondary | ICD-10-CM

## 2020-10-06 DIAGNOSIS — R0600 Dyspnea, unspecified: Secondary | ICD-10-CM

## 2020-10-06 DIAGNOSIS — R0609 Other forms of dyspnea: Secondary | ICD-10-CM

## 2020-10-06 MED ORDER — METOPROLOL TARTRATE 50 MG PO TABS: ORAL_TABLET | ORAL | 0 refills | Status: DC

## 2020-10-06 NOTE — Patient Instructions (Addendum)
Medication Instructions:  Your physician recommends that you continue on your current medications as directed. Please refer to the Current Medication list given to you today.  *If you need a refill on your cardiac medications before your next appointment, please call your pharmacy*   Lab Work: NONE If you have labs (blood work) drawn today and your tests are completely normal, you will receive your results only by: Marland Kitchen MyChart Message (if you have MyChart) OR . A paper copy in the mail If you have any lab test that is abnormal or we need to change your treatment, we will call you to review the results.   Testing/Procedures: Your physician has requested that you have cardiac CT. Cardiac computed tomography (CT) is a painless test that uses an x-ray machine to take clear, detailed pictures of your heart.        Follow-Up: At [facility], you and your health needs are our priority.  As part of our continuing mission to provide you with exceptional heart care, we have created designated Provider Care Teams.  These Care Teams include your primary Cardiologist (physician) and Advanced Practice Providers (APPs -  Physician Assistants and Nurse Practitioners) who all work together to provide you with the care you need, when you need it.  We recommend signing up for the patient portal called "MyChart".  Sign up information is provided on this After Visit Summary.  MyChart is used to connect with patients for Virtual Visits (Telemedicine).  Patients are able to view lab/test results, encounter notes, upcoming appointments, etc.  Non-urgent messages can be sent to your provider as well.   To learn more about what you can do with MyChart, go to [email or web address].    Your next appointment:   3 month(s)  The format for your next appointment:   In Person  Provider:   You may see Gasper Sells, MD or one of the following Advanced Practice Providers on your designated Care Team:    Melina Copa, PA-C  Ermalinda Barrios, PA-C    Other Instructions Your cardiac CT will be scheduled at one of the below locations:   [facility] [address] [place], [place] [address] [phone]  [place] [address] [place], [place] [address] [phone]  If scheduled at [facility], please arrive at the [facility] main entrance (entrance A) of [facility] 30 minutes prior to test start time. Proceed to the [facility] Radiology Department (first floor) to check-in and test prep.  If scheduled at [facility], please arrive 15 mins early for check-in and test prep.  Please follow these instructions carefully (unless otherwise directed):  Hold all erectile dysfunction medications at least 3 days (72 hrs) prior to test.  On the Night Before the Test: . Be sure to Drink plenty of water. . Do not consume any caffeinated/decaffeinated beverages or chocolate 12 hours prior to your test. . Do not take any antihistamines 12 hours prior to your test.   On the Day of the Test: . Drink plenty of water until 1 hour prior to the test. . Do not eat any food 4 hours prior to the test. . You may take your regular medications prior to the test.  . Take metoprolol (Lopressor) 50mg  two hours prior to test.         After the Test: . Drink plenty of water. . After receiving IV contrast, you may experience a  mild flushed feeling. This is normal. . On occasion, you may experience a mild rash up to 24 hours after the test. This is not dangerous. If this occurs, you can take Benadryl 25 mg and increase your fluid intake. . If you experience trouble breathing, this can be serious. If it is severe call 911 IMMEDIATELY. If it is mild, please call our office. . If you take any of these medications: Glipizide/Metformin, Avandament, Glucavance, please do not take 48 hours after completing test  unless otherwise instructed.   Once we have confirmed authorization from your insurance company, we will call you to set up a date and time for your test. Based on how quickly your insurance processes prior authorizations requests, please allow up to 4 weeks to be contacted for scheduling your Cardiac CT appointment. Be advised that routine Cardiac CT appointments could be scheduled as many as 8 weeks after your provider has ordered it.  For non-scheduling related questions, please contact the cardiac imaging nurse navigator should you have any questions/concerns: Marchia Bond, Cardiac Imaging Nurse Navigator Gordy Clement, Cardiac Imaging Nurse Navigator [facility] Direct Office Dial: [phone]   For scheduling needs, including cancellations and rescheduling, please call [place], [phone].

## 2020-10-06 NOTE — Progress Notes (Signed)
Cardiology Office Note:    Date:  10/06/2020   ID:  PAX REASONER, DOB 09/01/1940, MRN [id number]  PCP:  Shon Baton, MD  [facility] Cardiologist:  Rudean Haskell MD [place] Electrophysiologist:  None   CC: Follow up DOE  History of Present Illness:    Lee Compton is a 80 y.o. male with a hx of aortic atherosclerosis and HLD, HTN, PVCs, PACs who presents for evaluation 07/05/20. In interim had Echocardiogram that was largely benign and normal BNP.  Patient notes that he is doing pretty good.  Since last visit notes that he is not as activite changes.  Relevant interval testing or therapy include change to increase in losartan, decrease norvasc, stop of CTZ, start of HCTZ.  There are no interval hospital/ED visit.    Notes that during hunting season, he was hauling a couple of deer back and was out of breath with them. No chest pain or pressure .  No SOB and no PND/Orthopnea.  No weight gain or leg swelling.  No palpitations or syncope .  Ambulatory blood pressure 127/72.  Past Medical History:  Diagnosis Date  . Abdominal pain   . Allergy   . Anxiety   . Arrhythmia   . Asthma   . Atherosclerosis of aorta ([place])   . Cataract    bilateral cateracts  . Collagenous colitis   . Constipation   . Cough   . Depression   . Diarrhea   . 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 Dyspnea on exertion   . Edema   . Effusion of left knee joint   . Fatty liver   . Gastro-esophageal reflux disease without esophagitis   . GERD (gastroesophageal reflux disease)   . Gout   . Herpes zoster   . Hypercholesterolemia   . Hyperkalemia   . Hypertension   . Impaired fasting glucose   . Impingement syndrome of right shoulder   . Insomnia   . Male erectile dysfunction, unspecified   . Nocturia   . Obesity   . 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 Other specified abnormal findings of blood chemistry   . Other specified disorders of bone density and structure,  unspecified site   . Pain in left knee   . Pain in right ankle and joints of right foot   . Peripheral vascular disease, unspecified ([place])   . Rash and other nonspecific skin eruption   . Shingles   . Skin infection   . SOB (shortness of breath)   . Tinnitus   . Unspecified fracture of unspecified wrist and hand, sequela   . Unspecified osteoarthritis, unspecified site   . Ventricular premature depolarization    Past Surgical History:  Procedure Laterality Date  . [place]  . CATARACT EXTRACTION    . COLONOSCOPY    . LUMBAR LAMINECTOMY    . NASAL SINUS SURGERY  1980's, 1998  . OPEN REDUCTION INTERNAL FIXATION (ORIF) DISTAL RADIAL FRACTURE Left 04/27/2017   Procedure: IRRIGATION AND DEBRIDEMENT WITH OPEN REDUCTION INTERNAL FIXATION (ORIF) DISTAL RADIAL FRACTURE;  Surgeon: Roseanne Kaufman, MD;  Location: [facility];  Service: Orthopedics;  Laterality: Left;  . ROTATOR CUFF REPAIR     right shoulder  . sinus surgeries     Current Medications: Current Meds  Medication Sig  . amLODipine (NORVASC) 2.5 MG tablet Take 2.5 mg by mouth daily.  Marland Kitchen aspirin EC 81 MG tablet Take 81 mg by mouth  daily.  . budesonide (ENTOCORT EC) 3 MG 24 hr capsule Take 3 capsules (9 mg total) by mouth daily.  . colchicine 0.6 MG tablet Take 0.3 mg by mouth daily.  . diclofenac (VOLTAREN) 75 MG EC tablet Take 75 mg by mouth 2 (two) times daily.  . DULoxetine (CYMBALTA) 60 MG capsule Take 60 mg by mouth daily.  . Fluticasone-Salmeterol (ADVAIR) 100-50 MCG/DOSE AEPB Inhale 1 puff into the lungs daily.  . hydrochlorothiazide (HYDRODIURIL) 25 MG tablet Take 25 mg by mouth daily.  Marland Kitchen losartan (COZAAR) 100 MG tablet Take 100 mg by mouth daily.  . metoprolol tartrate (LOPRESSOR) 50 MG tablet Take 2 hours prior to Cardiac CT  . mupirocin ointment (BACTROBAN) 2 % 1 application 2 (two) times daily.  Marland Kitchen omega-3 acid ethyl esters (LOVAZA) 1 g capsule Take 1 g by mouth daily.  . rosuvastatin (CRESTOR) 10 MG tablet Take  10 mg by mouth daily.  Marland Kitchen saccharomyces boulardii (FLORASTOR) 250 MG capsule Take 250 mg by mouth 2 (two) times daily.  . tadalafil (CIALIS) 5 MG tablet Take 5 mg by mouth daily as needed for erectile dysfunction.  . [DISCONTINUED] CHLOROTHIAZIDE PO Take 25 mg by mouth.  . [DISCONTINUED] gabapentin (NEURONTIN) 100 MG capsule Take 100 mg by mouth 3 (three) times daily.  . [DISCONTINUED] valACYclovir (VALTREX) 1000 MG tablet Take 1,000 mg by mouth 2 (two) times daily.  . [DISCONTINUED] venlafaxine XR (EFFEXOR-XR) 75 MG 24 hr capsule Take 75 mg by mouth daily with breakfast.    Allergies:   Patient has no known allergies.   Social History   Socioeconomic History  . Marital status: Widowed    Spouse name: Not on file  . Number of children: Not on file  . Years of education: Not on file  . Highest education level: Not on file  Occupational History  . Not on file  Tobacco Use  . Smoking status: Former Smoker    Types: Cigarettes    Quit date: 07/27/1969    Years since quitting: 51.2  . Smokeless tobacco: Former Systems developer    Types: Chew  . Tobacco comment: many years that pt quit chew  Vaping Use  . Vaping Use: Never used  Substance and Sexual Activity  . Alcohol use: Yes    Alcohol/week: 3.0 - 4.0 standard drinks    Types: 3 - 4 Cans of beer per week  . Drug use: No  . Sexual activity: Not on file  Other Topics Concern  . Not on file  Social History Narrative  . Not on file   Social Determinants of Health   Financial Resource Strain: Not on file  Food Insecurity: Not on file  Transportation Needs: Not on file  Physical Activity: Not on file  Stress: Not on file  Social Connections: Not on file    Family History: The patient's family history includes Alzheimer's disease in his mother; CVA in his father; Diabetes Mellitus II in his daughter; Heart disease in his brother and father; Pancreatic cancer in his brother. There is no history of Colon cancer, Stomach cancer, Esophageal  cancer, Prostate cancer, Rectal cancer, or Liver cancer.  ROS:   Please see the history of present illness.    All other systems reviewed and are negative.  EKGs/Labs/Other Studies Reviewed:    The following studies were reviewed today:  EKG:    07/05/20: 1st EKG  SR rate 95 with occasional PVC 2nd EKG SR rate 91 with occasional PAC and PVC  Transthoracic  Echocardiogram: Date: 07/19/20 Results: IMPRESSIONS  1. Left ventricular ejection fraction, by estimation, is 55 to 60%. The  left ventricle has normal function. The left ventricle has no regional  wall motion abnormalities. There is mild left ventricular hypertrophy.  Left ventricular diastolic parameters  are consistent with Grade I diastolic dysfunction (impaired relaxation).  2. Right ventricular systolic function is normal. The right ventricular  size is normal. There is normal pulmonary artery systolic pressure. The  estimated right ventricular systolic pressure is 67.1 mmHg.  3. The mitral valve is normal in structure. Trivial mitral valve  regurgitation. No evidence of mitral stenosis.  4. The aortic valve is tricuspid. There is mild calcification of the  aortic valve. Aortic valve regurgitation is not visualized. No aortic  stenosis is present.  5. The inferior vena cava is normal in size with greater than 50%  respiratory variability, suggesting right atrial pressure of 3 mmHg.   Recent Labs: 07/05/2020: BUN 17; Creatinine, Ser 0.95; NT-Pro BNP 50; Potassium 4.3; Sodium 141  Recent Lipid Panel No results found for: CHOL, TRIG, HDL, CHOLHDL, VLDL, LDLCALC, LDLDIRECT  05/20/20 OSH Labs: Creatinine 1.0 K 5.4 AST/ALT 24/32 Cholesterol 133; LDL 52, HDL 42, Triglycerides 194 (non fasting)  Physical Exam:    VS:  BP 122/62   Pulse 74   Ht 5\' 10"  (1.778 m)   Wt 204 lb 12.8 oz (92.9 kg)   SpO2 98%   BMI 29.39 kg/m     Wt Readings from Last 3 Encounters:  10/06/20 204 lb 12.8 oz (92.9 kg)  07/05/20 218 lb  (98.9 kg)  01/30/18 203 lb (92.1 kg)    GEN: Well nourished, well developed in no acute distress  HEENT: Normal NECK: No JVD; No carotid bruits LYMPHATICS: No lymphadenopathy CARDIAC: S1 S2, no murmurs, rubs, gallops RESPIRATORY:  Clear to auscultation without rales, wheezing or rhonchi  ABDOMEN: Soft, non-tender, non-distended MUSCULOSKELETAL:  No edema; No deformity  SKIN: Warm and dry NEUROLOGIC:  Alert and oriented x 3 PSYCHIATRIC:  Normal affect   ASSESSMENT:    1. DOE (dyspnea on exertion)   2. Essential hypertension   3. PAC (premature atrial contraction)   4. PVC (premature ventricular contraction)   5. Aortic atherosclerosis (HCC)    PLAN:    In order of problems listed above:   DOE concerning for angina equivalent - Continue ASA 81 mg QD, current statin - Would recommend CCTA with possible FFR as needed to exclude obstructive CAD and to assess for non-obstructive CAD requiring secondary prevention  Essential Hypertension - ambulatory blood pressure elevated 120/80, will continue ambulatory BP monitoring (gave education) - continue home medications  - Recommened sleep study at last visit - discussed diet (DASH/low sodium), and exercise/weight loss interventions  HLD Aortic Atherosclerosis - at LDL goal on rosuvastatin 10 mg  PVCs PACs - asymptomatic; will defer AV nodal therapy presently  3 months months follow up unless new symptoms or abnormal test results warranting change in plan   Medication Adjustments/Labs and Tests Ordered: Current medicines are reviewed at length with the patient today.  Concerns regarding medicines are outlined above.  Orders Placed This Encounter  Procedures  . CT CORONARY MORPH W/CTA COR W/SCORE W/CA W/CM &/OR WO/CM  . CT CORONARY FRACTIONAL FLOW RESERVE DATA PREP  . CT CORONARY FRACTIONAL FLOW RESERVE FLUID ANALYSIS  . Basic metabolic panel   Meds ordered this encounter  Medications  . metoprolol tartrate (LOPRESSOR) 50  MG tablet    Sig: Take 2  hours prior to Cardiac CT    Dispense:  1 tablet    Refill:  0    Patient Instructions  Medication Instructions:  Your physician recommends that you continue on your current medications as directed. Please refer to the Current Medication list given to you today.  *If you need a refill on your cardiac medications before your next appointment, please call your pharmacy*   Lab Work: NONE If you have labs (blood work) drawn today and your tests are completely normal, you will receive your results only by: Marland Kitchen MyChart Message (if you have MyChart) OR . A paper copy in the mail If you have any lab test that is abnormal or we need to change your treatment, we will call you to review the results.   Testing/Procedures: Your physician has requested that you have cardiac CT. Cardiac computed tomography (CT) is a painless test that uses an x-ray machine to take clear, detailed pictures of your heart.        Follow-Up: At [facility], you and your health needs are our priority.  As part of our continuing mission to provide you with exceptional heart care, we have created designated Provider Care Teams.  These Care Teams include your primary Cardiologist (physician) and Advanced Practice Providers (APPs -  Physician Assistants and Nurse Practitioners) who all work together to provide you with the care you need, when you need it.  We recommend signing up for the patient portal called "MyChart".  Sign up information is provided on this After Visit Summary.  MyChart is used to connect with patients for Virtual Visits (Telemedicine).  Patients are able to view lab/test results, encounter notes, upcoming appointments, etc.  Non-urgent messages can be sent to your provider as well.   To learn more about what you can do with MyChart, go to [email or web address].    Your next appointment:   3 month(s)  The format for your next appointment:   In Person  Provider:   You  may see Gasper Sells, MD or one of the following Advanced Practice Providers on your designated Care Team:    Melina Copa, PA-C  Ermalinda Barrios, PA-C    Other Instructions Your cardiac CT will be scheduled at one of the below locations:   [facility] [address] [place], [place] [address] [phone]  [place] [address] [place], [place] [address] [phone]  If scheduled at [facility], please arrive at the [facility] main entrance (entrance A) of [facility] 30 minutes prior to test start time. Proceed to the [facility] Radiology Department (first floor) to check-in and test prep.  If scheduled at [facility], please arrive 15 mins early for check-in and test prep.  Please follow these instructions carefully (unless otherwise directed):  Hold all erectile dysfunction medications at least 3 days (72 hrs) prior to test.  On the Night Before the Test: . Be sure to Drink plenty of water. . Do not consume any caffeinated/decaffeinated beverages or chocolate 12 hours prior to your test. . Do not take any antihistamines 12 hours prior to your test.   On the Day of the Test: . Drink plenty of water until 1 hour prior to the test. . Do not eat any food 4 hours prior to the test. . You may take your regular medications prior to the test.  . Take metoprolol (Lopressor) 50mg  two hours prior to test.  After the Test: . Drink plenty of water. . After receiving IV contrast, you may experience a mild flushed feeling. This is normal. . On occasion, you may experience a mild rash up to 24 hours after the test. This is not dangerous. If this occurs, you can take Benadryl 25 mg and increase your fluid intake. . If you experience trouble breathing, this can be serious. If it is severe call 911 IMMEDIATELY. If it is mild, please call our office. . If you take  any of these medications: Glipizide/Metformin, Avandament, Glucavance, please do not take 48 hours after completing test unless otherwise instructed.   Once we have confirmed authorization from your insurance company, we will call you to set up a date and time for your test. Based on how quickly your insurance processes prior authorizations requests, please allow up to 4 weeks to be contacted for scheduling your Cardiac CT appointment. Be advised that routine Cardiac CT appointments could be scheduled as many as 8 weeks after your provider has ordered it.  For non-scheduling related questions, please contact the cardiac imaging nurse navigator should you have any questions/concerns: Marchia Bond, Cardiac Imaging Nurse Navigator Gordy Clement, Cardiac Imaging Nurse Navigator [facility] Direct Office Dial: [phone]   For scheduling needs, including cancellations and rescheduling, please call [place], [phone].       Signed, Werner Lean, MD  10/06/2020 12:22 PM    [facility]

## 2021-01-12 ENCOUNTER — Other Ambulatory Visit: Payer: Self-pay

## 2021-01-12 ENCOUNTER — Ambulatory Visit: Payer: Medicare Other | Admitting: Internal Medicine

## 2021-01-12 ENCOUNTER — Encounter: Payer: Self-pay | Admitting: Internal Medicine

## 2021-01-12 VITALS — BP 112/68 | HR 67 | Ht 70.0 in | Wt 196.0 lb

## 2021-01-12 DIAGNOSIS — E782 Mixed hyperlipidemia: Secondary | ICD-10-CM | POA: Diagnosis not present

## 2021-01-12 DIAGNOSIS — I700 Atherosclerosis of aorta: Secondary | ICD-10-CM | POA: Diagnosis not present

## 2021-01-12 DIAGNOSIS — R0600 Dyspnea, unspecified: Secondary | ICD-10-CM | POA: Diagnosis not present

## 2021-01-12 DIAGNOSIS — I491 Atrial premature depolarization: Secondary | ICD-10-CM

## 2021-01-12 DIAGNOSIS — I10 Essential (primary) hypertension: Secondary | ICD-10-CM | POA: Diagnosis not present

## 2021-01-12 DIAGNOSIS — I493 Ventricular premature depolarization: Secondary | ICD-10-CM

## 2021-01-12 DIAGNOSIS — R0609 Other forms of dyspnea: Secondary | ICD-10-CM

## 2021-01-12 NOTE — Addendum Note (Signed)
Addended by: Jeremy Johann on: 01/12/2021 02:28 PM   Modules accepted: Orders

## 2021-01-12 NOTE — Patient Instructions (Signed)
Medication Instructions:  Your physician recommends that you continue on your current medications as directed. Please refer to the Current Medication list given to you today.  *If you need a refill on your cardiac medications before your next appointment, please call your pharmacy*   Lab Work: NONE If you have labs (blood work) drawn today and your tests are completely normal, you will receive your results only by: Marland Kitchen MyChart Message (if you have MyChart) OR . A paper copy in the mail If you have any lab test that is abnormal or we need to change your treatment, we will call you to review the results.   Testing/Procedures: Your physician has requested that you have en exercise stress myoview. For further information please visit [email or web address]. Please follow instruction sheet, as given.   You are scheduled for a Myocardial Perfusion Imaging Study. Please arrive 15 minutes prior to your appointment time for registration and insurance purposes.   The test will take approximately 3 to 4 hours to complete; you may bring reading material.  If someone comes with you to your appointment, they will need to remain in the main lobby due to limited space in the testing area. **If you are pregnant or breastfeeding, please notify the nuclear lab prior to your appointment**   How to prepare for your Myocardial Perfusion Test:  Do not eat or drink 3 hours prior to your test, except you may have water.  Do not consume products containing caffeine (regular or decaffeinated) 12 hours prior to your test. (ex: coffee, chocolate, sodas, tea).  Do bring a list of your current medications with you.  If not listed below, you may take your medications as normal.  Do wear comfortable clothes (no dresses or overalls) and walking shoes, tennis shoes preferred (No heels or open toe shoes are allowed).  Do NOT wear cologne, perfume, aftershave, or lotions (deodorant is allowed).  If these instructions are  not followed, your test will have to be rescheduled.  If you cannot keep your appointment, please provide 24 hours notification to the Nuclear Lab, to avoid a possible $50 charge to your account.       Follow-Up: At [facility], you and your health needs are our priority.  As part of our continuing mission to provide you with exceptional heart care, we have created designated Provider Care Teams.  These Care Teams include your primary Cardiologist (physician) and Advanced Practice Providers (APPs -  Physician Assistants and Nurse Practitioners) who all work together to provide you with the care you need, when you need it.  We recommend signing up for the patient portal called "MyChart".  Sign up information is provided on this After Visit Summary.  MyChart is used to connect with patients for Virtual Visits (Telemedicine).  Patients are able to view lab/test results, encounter notes, upcoming appointments, etc.  Non-urgent messages can be sent to your provider as well.   To learn more about what you can do with MyChart, go to [email or web address].    Your next appointment:   6-7 month(s)  The format for your next appointment:   In Person  Provider:   You may see Rudean Haskell, MD or one of the following Advanced Practice Providers on your designated Care Team:    Melina Copa, PA-C  Ermalinda Barrios, PA-C

## 2021-01-12 NOTE — Progress Notes (Signed)
Cardiology Office Note:    Date:  01/12/2021   ID:  Lee Compton, DOB [birth year], MRN [id number]  PCP:  Lee Baton, MD  [facility] Cardiologist:  Rudean Haskell MD [place] Electrophysiologist:  None   CC: Follow up DOE  History of Present Illness:    Lee Compton is a 80 y.o. male with a hx of Aortic Atherosclerosis and HLD, HTN, PVCs, PACs who presents for evaluation 07/05/20. In interim had Echocardiogram that was largely benign and normal BNP.  In 2/22 visit has DOE with possible angina equivalent; did not have CCTA as he was in busy.  Seen 5/22; during the international contrast crisis.  Patient notes that he is doing just fine.  Since last visit notes no changes.   There are no interval hospital/ED visit.    No chest pain or pressure.  Notes worsening shortness of breath, this has been getting worse.  No resting symptoms but worse with activity.  Has exertional fatigue. PND/Orthopnea.  No weight gain or leg swelling.  No palpitations or syncope .  Ambulatory BP: Not done  Past Medical History:  Diagnosis Date  . Abdominal pain   . Allergy   . Anxiety   . Arrhythmia   . Asthma   . Atherosclerosis of aorta ([place])   . Cataract    bilateral cateracts  . Collagenous colitis   . Constipation   . Cough   . Depression   . Diarrhea   . 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 Dyspnea on exertion   . Edema   . Effusion of left knee joint   . Fatty liver   . Gastro-esophageal reflux disease without esophagitis   . GERD (gastroesophageal reflux disease)   . Gout   . Herpes zoster   . Hypercholesterolemia   . Hyperkalemia   . Hypertension   . Impaired fasting glucose   . Impingement syndrome of right shoulder   . Insomnia   . Male erectile dysfunction, unspecified   . Nocturia   . Obesity   . 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 Other specified abnormal findings of blood chemistry   . Other specified disorders of bone density and  structure, unspecified site   . Pain in left knee   . Pain in right ankle and joints of right foot   . Peripheral vascular disease, unspecified ([place])   . Rash and other nonspecific skin eruption   . Shingles   . Skin infection   . SOB (shortness of breath)   . Tinnitus   . Unspecified fracture of unspecified wrist and hand, sequela   . Unspecified osteoarthritis, unspecified site   . Ventricular premature depolarization    Past Surgical History:  Procedure Laterality Date  . [place]  . CATARACT EXTRACTION    . COLONOSCOPY    . LUMBAR LAMINECTOMY    . NASAL SINUS SURGERY  1980's, 1998  . OPEN REDUCTION INTERNAL FIXATION (ORIF) DISTAL RADIAL FRACTURE Left 04/27/2017   Procedure: IRRIGATION AND DEBRIDEMENT WITH OPEN REDUCTION INTERNAL FIXATION (ORIF) DISTAL RADIAL FRACTURE;  Surgeon: Roseanne Kaufman, MD;  Location: [place];  Service: Orthopedics;  Laterality: Left;  . ROTATOR CUFF REPAIR     right shoulder  . sinus surgeries     Current Medications: Current Meds  Medication Sig  . amLODipine (NORVASC) 2.5 MG tablet Take 2.5 mg by mouth daily.  Marland Kitchen aspirin EC 81 MG tablet Take 81 mg by mouth  daily.  . colchicine 0.6 MG tablet Take 0.3 mg by mouth daily.  . DULoxetine (CYMBALTA) 60 MG capsule Take 60 mg by mouth daily.  . Fluticasone-Salmeterol (ADVAIR) 100-50 MCG/DOSE AEPB Inhale 1 puff into the lungs daily.  . hydrochlorothiazide (HYDRODIURIL) 25 MG tablet Take 25 mg by mouth daily.  Marland Kitchen. losartan (COZAAR) 100 MG tablet Take 100 mg by mouth daily.  . rosuvastatin (CRESTOR) 10 MG tablet Take 10 mg by mouth daily.    Allergies:   Patient has no known allergies.   Social History   Socioeconomic History  . Marital status: Widowed    Spouse name: Not on file  . Number of children: Not on file  . Years of education: Not on file  . Highest education level: Not on file  Occupational History  . Not on file  Tobacco Use  . Smoking status: Former Smoker    Types: Cigarettes     Quit date: 07/27/1969    Years since quitting: 51.4  . Smokeless tobacco: Former NeurosurgeonUser    Types: Chew  . Tobacco comment: many years that pt quit chew  Vaping Use  . Vaping Use: Never used  Substance and Sexual Activity  . Alcohol use: Yes    Alcohol/week: 3.0 - 4.0 standard drinks    Types: 3 - 4 Cans of beer per week  . Drug use: No  . Sexual activity: Not on file  Other Topics Concern  . Not on file  Social History Narrative  . Not on file   Social Determinants of Health   Financial Resource Strain: Not on file  Food Insecurity: Not on file  Transportation Needs: Not on file  Physical Activity: Not on file  Stress: Not on file  Social Connections: Not on file    Social: hunting and gardening but needs a chair to rest and now has a Water quality scientistwheelbarrow for .  Family History: The patient's family history includes Alzheimer's disease in his mother; CVA in his father; Diabetes Mellitus II in his daughter; Heart disease in his brother and father; Pancreatic cancer in his brother. There is no history of Colon cancer, Stomach cancer, Esophageal cancer, Prostate cancer, Rectal cancer, or Liver cancer.  ROS:   Please see the history of present illness.    All other systems reviewed and are negative.  EKGs/Labs/Other Studies Reviewed:    The following studies were reviewed today:  EKG:    07/05/20: 1st EKG  SR rate 95 with occasional PVC 2nd EKG SR rate 91 with occasional PAC and PVC  Transthoracic Echocardiogram: Date: 07/19/20 Results: IMPRESSIONS  1. Left ventricular ejection fraction, by estimation, is 55 to 60%. The  left ventricle has normal function. The left ventricle has no regional  wall motion abnormalities. There is mild left ventricular hypertrophy.  Left ventricular diastolic parameters  are consistent with Grade I diastolic dysfunction (impaired relaxation).  2. Right ventricular systolic function is normal. The right ventricular  size is normal. There is normal  pulmonary artery systolic pressure. The  estimated right ventricular systolic pressure is 25.1 mmHg.  3. The mitral valve is normal in structure. Trivial mitral valve  regurgitation. No evidence of mitral stenosis.  4. The aortic valve is tricuspid. There is mild calcification of the  aortic valve. Aortic valve regurgitation is not visualized. No aortic  stenosis is present.  5. The inferior vena cava is normal in size with greater than 50%  respiratory variability, suggesting right atrial pressure of 3 mmHg.  Recent Labs: 07/05/2020: BUN 17; Creatinine, Ser 0.95; NT-Pro BNP 50; Potassium 4.3; Sodium 141  Recent Lipid Panel No results found for: CHOL, TRIG, HDL, CHOLHDL, VLDL, LDLCALC, LDLDIRECT  05/20/20 OSH Labs: Creatinine 1.0 K 5.4 AST/ALT 24/32 Cholesterol 133; LDL 52, HDL 42, Triglycerides 194 (non fasting)  Physical Exam:    VS:  BP 112/68   Pulse 67   Ht 5\' 10"  (1.778 m)   Wt 88.9 kg   SpO2 98%   BMI 28.12 kg/m     Wt Readings from Last 3 Encounters:  01/12/21 88.9 kg  10/06/20 92.9 kg  07/05/20 98.9 kg    GEN: Well nourished, well developed in no acute distress  HEENT: Normal NECK: No JVD LYMPHATICS: No lymphadenopathy CARDIAC: S1 S2, no murmurs, rubs, gallops RESPIRATORY:  Clear to auscultation without rales, wheezing or rhonchi  ABDOMEN: Soft, non-tender, non-distended MUSCULOSKELETAL:  No edema; No deformity  SKIN: Warm and dry NEUROLOGIC:  Alert and oriented x 3 PSYCHIATRIC:  Normal affect   ASSESSMENT:    1. DOE (dyspnea on exertion)    PLAN:    In order of problems listed above:  DOE concerning for angina equivalent HTN Aortic Atherosclerosis & HLD (hypertryglyceridemia) PVCs and PACs - Continue ASA 81 mg QD, current rosuvastatin 10 mg  - presently unable to offer CCTA in the setting of the international contrast crisis - Would recommend exercise (ok to convert to pharmacological if needed) nuclear medicine stress test (NPO at midnight);  discussed risks, benefits, and alternatives of the diagnostic procedure including chest pain, arrhythmia, and death.  Patient amenable for testing. - if positive, discussed risks and benefits of cardiac catheterization have been discussed with the patient.  These include bleeding, infection, kidney damage, stroke, heart attack, death.  The patient understands these risks and is willing to proceed if necessary - continue norvasc 2.5 mg, HCTZ 25 mg controlled with PC MD regimen - reviewed 2019 CT with patient    Medication Adjustments/Labs and Tests Ordered: Current medicines are reviewed at length with the patient today.  Concerns regarding medicines are outlined above.  Orders Placed This Encounter  Procedures  . Cardiac Stress Test: Informed Consent Details: Physician/Practitioner Attestation; Transcribe to consent form and obtain patient signature  . MYOCARDIAL PERFUSION IMAGING   No orders of the defined types were placed in this encounter.   Patient Instructions  Medication Instructions:  Your physician recommends that you continue on your current medications as directed. Please refer to the Current Medication list given to you today.  *If you need a refill on your cardiac medications before your next appointment, please call your pharmacy*   Lab Work: NONE If you have labs (blood work) drawn today and your tests are completely normal, you will receive your results only by: Marland Kitchen MyChart Message (if you have MyChart) OR . A paper copy in the mail If you have any lab test that is abnormal or we need to change your treatment, we will call you to review the results.   Testing/Procedures: Your physician has requested that you have en exercise stress myoview. For further information please visit [email or web address]. Please follow instruction sheet, as given.   You are scheduled for a Myocardial Perfusion Imaging Study. Please arrive 15 minutes prior to your appointment time for  registration and insurance purposes.   The test will take approximately 3 to 4 hours to complete; you may bring reading material.  If someone comes with you to your appointment, they will need to remain  in the main lobby due to limited space in the testing area. **If you are pregnant or breastfeeding, please notify the nuclear lab prior to your appointment**   How to prepare for your Myocardial Perfusion Test:  Do not eat or drink 3 hours prior to your test, except you may have water.  Do not consume products containing caffeine (regular or decaffeinated) 12 hours prior to your test. (ex: coffee, chocolate, sodas, tea).  Do bring a list of your current medications with you.  If not listed below, you may take your medications as normal.  Do wear comfortable clothes (no dresses or overalls) and walking shoes, tennis shoes preferred (No heels or open toe shoes are allowed).  Do NOT wear cologne, perfume, aftershave, or lotions (deodorant is allowed).  If these instructions are not followed, your test will have to be rescheduled.  If you cannot keep your appointment, please provide 24 hours notification to the Nuclear Lab, to avoid a possible $50 charge to your account.       Follow-Up: At [facility], you and your health needs are our priority.  As part of our continuing mission to provide you with exceptional heart care, we have created designated Provider Care Teams.  These Care Teams include your primary Cardiologist (physician) and Advanced Practice Providers (APPs -  Physician Assistants and Nurse Practitioners) who all work together to provide you with the care you need, when you need it.  We recommend signing up for the patient portal called "MyChart".  Sign up information is provided on this After Visit Summary.  MyChart is used to connect with patients for Virtual Visits (Telemedicine).  Patients are able to view lab/test results, encounter notes, upcoming appointments, etc.   Non-urgent messages can be sent to your provider as well.   To learn more about what you can do with MyChart, go to [email or web address].    Your next appointment:   6-7 month(s)  The format for your next appointment:   In Person  Provider:   You may see Rudean Haskell, MD or one of the following Advanced Practice Providers on your designated Care Team:    Melina Copa, PA-C  Ermalinda Barrios, PA-C          Signed, Werner Lean, MD  01/12/2021 11:37 AM    [place]

## 2021-01-25 ENCOUNTER — Telehealth (HOSPITAL_COMMUNITY): Payer: Self-pay | Admitting: *Deleted

## 2021-01-25 NOTE — Telephone Encounter (Signed)
Patient given detailed instructions per Myocardial Perfusion Study Information Sheet for the test on 01/30/21 at 10:30. Patient notified to arrive 15 minutes early and that it is imperative to arrive on time for appointment to keep from having the test rescheduled.  If you need to cancel or reschedule your appointment, please call the office within 24 hours of your appointment. . Patient verbalized understanding.Lee Compton

## 2021-01-30 ENCOUNTER — Ambulatory Visit (HOSPITAL_COMMUNITY): Payer: Medicare Other | Attending: Cardiology

## 2021-01-30 ENCOUNTER — Other Ambulatory Visit: Payer: Self-pay

## 2021-01-30 DIAGNOSIS — R0609 Other forms of dyspnea: Secondary | ICD-10-CM

## 2021-01-30 DIAGNOSIS — R0600 Dyspnea, unspecified: Secondary | ICD-10-CM | POA: Insufficient documentation

## 2021-01-30 LAB — MYOCARDIAL PERFUSION IMAGING
LV dias vol: 92 mL (ref 62–150)
LV sys vol: 47 mL
Peak HR: 115 {beats}/min
Rest HR: 81 {beats}/min
SDS: 3
SRS: 0
SSS: 3
TID: 1.07

## 2021-01-30 MED ORDER — TECHNETIUM TC 99M TETROFOSMIN IV KIT
31.3000 | PACK | Freq: Once | INTRAVENOUS | Status: AC | PRN
  Administered 2021-01-30: 31.3 via INTRAVENOUS
  Filled 2021-01-30: qty 32

## 2021-01-30 MED ORDER — TECHNETIUM TC 99M TETROFOSMIN IV KIT
10.1000 | PACK | Freq: Once | INTRAVENOUS | Status: AC | PRN
  Administered 2021-01-30: 10.1 via INTRAVENOUS
  Filled 2021-01-30: qty 11

## 2021-01-30 MED ORDER — REGADENOSON 0.4 MG/5ML IV SOLN
0.4000 mg | Freq: Once | INTRAVENOUS | Status: AC
  Administered 2021-01-30: 0.4 mg via INTRAVENOUS

## 2021-02-06 ENCOUNTER — Ambulatory Visit (HOSPITAL_COMMUNITY): Payer: Medicare Other

## 2021-08-06 NOTE — Progress Notes (Deleted)
Cardiology Office Note:    Date:  08/06/2021   ID:  SAMARTH OGLE, DOB [birth year], MRN [id number]  PCP:  Shon Baton, MD  [facility] Cardiologist:  Rudean Haskell MD [place] Electrophysiologist:  None   CC: Follow up DOE  History of Present Illness:    Lee Compton is a 80 y.o. male with a hx of Aortic Atherosclerosis and HLD, HTN, PVCs, PACs who presents for evaluation 07/05/20. In interim had Echocardiogram that was largely benign and normal BNP.  In 2/22 visit has DOE with possible angina equivalent; did not have CCTA as he was in busy.  Seen 5/22; during the international contrast crisis. Had NM Stress test that was negative.  Seen 08/07/21 (his birthday is coming up***).  Patient notes that he is doing ***.   Since day prior/last visit notes *** . There are no*** interval hospital/ED visit.    No chest pain or pressure ***.  No SOB/DOE*** and no PND/Orthopnea***.  No weight gain or leg swelling***.  No palpitations or syncope ***.  Ambulatory blood pressure ***.   Past Medical History:  Diagnosis Date   Abdominal pain    Allergy    Anxiety    Arrhythmia    Asthma    Atherosclerosis of aorta ([facility])    Cataract    bilateral cateracts   Collagenous colitis    Constipation    Cough    Depression    Diarrhea    Disorder of adrenal gland ([facility])    Dyspnea on exertion    Edema    Effusion of left knee joint    Fatty liver    Gastro-esophageal reflux disease without esophagitis    GERD (gastroesophageal reflux disease)    Gout    Herpes zoster    Hypercholesterolemia    Hyperkalemia    Hypertension    Impaired fasting glucose    Impingement syndrome of right shoulder    Insomnia    Male erectile dysfunction, unspecified    Nocturia    Obesity    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    Other specified abnormal findings of blood chemistry    Other specified disorders of bone density and structure, unspecified site    Pain in left  knee    Pain in right ankle and joints of right foot    Peripheral vascular disease, unspecified ([facility])    Rash and other nonspecific skin eruption    Shingles    Skin infection    SOB (shortness of breath)    Tinnitus    Unspecified fracture of unspecified wrist and hand, sequela    Unspecified osteoarthritis, unspecified site    Ventricular premature depolarization    Past Surgical History:  Procedure Laterality Date   BACK SURGERY  1973   CATARACT EXTRACTION     COLONOSCOPY     LUMBAR LAMINECTOMY     NASAL SINUS SURGERY  1980's, 1998   OPEN REDUCTION INTERNAL FIXATION (ORIF) DISTAL RADIAL FRACTURE Left 04/27/2017   Procedure: IRRIGATION AND DEBRIDEMENT WITH OPEN REDUCTION INTERNAL FIXATION (ORIF) DISTAL RADIAL FRACTURE;  Surgeon: Roseanne Kaufman, MD;  Location: [facility];  Service: Orthopedics;  Laterality: Left;   ROTATOR CUFF REPAIR     right shoulder   sinus surgeries     Current Medications: No outpatient medications have been marked as taking for the 08/07/21 encounter (Appointment) with Werner Lean, MD.    Allergies:   Patient has no known allergies.   Social  History   Socioeconomic History   Marital status: Widowed    Spouse name: Not on file   Number of children: Not on file   Years of education: Not on file   Highest education level: Not on file  Occupational History   Not on file  Tobacco Use   Smoking status: Former    Types: Cigarettes    Quit date: 07/27/1969    Years since quitting: 52.0   Smokeless tobacco: Former    Types: Chew   Tobacco comments:    many years that pt quit chew  Vaping Use   Vaping Use: Never used  Substance and Sexual Activity   Alcohol use: Yes    Alcohol/week: 3.0 - 4.0 standard drinks    Types: 3 - 4 Cans of beer per week   Drug use: No   Sexual activity: Not on file  Other Topics Concern   Not on file  Social History Narrative   Not on file   Social Determinants of Health   Financial Resource Strain: Not on  file  Food Insecurity: Not on file  Transportation Needs: Not on file  Physical Activity: Not on file  Stress: Not on file  Social Connections: Not on file    Social: hunting and gardening but needs a chair to rest and now has a Orthoptist for .  Family History: The patient's family history includes Alzheimer's disease in his mother; CVA in his father; Diabetes Mellitus II in his daughter; Heart disease in his brother and father; Pancreatic cancer in his brother. There is no history of Colon cancer, Stomach cancer, Esophageal cancer, Prostate cancer, Rectal cancer, or Liver cancer.  ROS:   Please see the history of present illness.    All other systems reviewed and are negative.  EKGs/Labs/Other Studies Reviewed:    The following studies were reviewed today:  EKG:    07/05/20: 1st EKG  SR rate 95 with occasional PVC 2nd EKG SR rate 91 with occasional PAC and PVC  Transthoracic Echocardiogram: Date: 07/19/20 Results: IMPRESSIONS   1. Left ventricular ejection fraction, by estimation, is 55 to 60%. The  left ventricle has normal function. The left ventricle has no regional  wall motion abnormalities. There is mild left ventricular hypertrophy.  Left ventricular diastolic parameters  are consistent with Grade I diastolic dysfunction (impaired relaxation).   2. Right ventricular systolic function is normal. The right ventricular  size is normal. There is normal pulmonary artery systolic pressure. The  estimated right ventricular systolic pressure is 42.6 mmHg.   3. The mitral valve is normal in structure. Trivial mitral valve  regurgitation. No evidence of mitral stenosis.   4. The aortic valve is tricuspid. There is mild calcification of the  aortic valve. Aortic valve regurgitation is not visualized. No aortic  stenosis is present.   5. The inferior vena cava is normal in size with greater than 50%  respiratory variability, suggesting right atrial pressure of 3 mmHg.   NM  Stress Testing : Date: 01/30/21 Results: The left ventricular ejection fraction is mildly decreased (45-54%). Nuclear stress EF: 49%. There was no ST segment deviation noted during stress. No T wave inversion was noted during stress. The study is normal. This is a low risk study.   1.  Reduced counts in the apex with normal wall motion consistent with apical thinning artifact. 2.  Normal study without evidence of ischemia or infarction. 3.  Normal LVEF for this modality, 49%. 4.  This is  a low risk study.    Recent Labs: No results found for requested labs within last 8760 hours.  Recent Lipid Panel No results found for: CHOL, TRIG, HDL, CHOLHDL, VLDL, LDLCALC, LDLDIRECT  05/20/20 OSH Labs: Creatinine 1.0 K 5.4 AST/ALT 24/32 Cholesterol 133; LDL 52, HDL 42, Triglycerides 194 (non fasting)  Physical Exam:    VS:  There were no vitals taken for this visit.    Wt Readings from Last 3 Encounters:  01/30/21 196 lb (88.9 kg)  01/12/21 196 lb (88.9 kg)  10/06/20 204 lb 12.8 oz (92.9 kg)    Gen: *** distress, *** obese/well nourished/malnourished   Neck: No JVD, *** carotid bruit Ears: *** Frank Sign Cardiac: No Rubs or Gallops, *** Murmur, ***cardia, *** radial pulses Respiratory: Clear to auscultation bilaterally, *** effort, ***  respiratory rate GI: Soft, nontender, non-distended *** MS: No *** edema; *** moves all extremities Integument: Skin feels *** Neuro:  At time of evaluation, alert and oriented to person/place/time/situation *** Psych: Normal affect, patient feels ***   ASSESSMENT:    No diagnosis found.  PLAN:    In order of problems listed above:  DOE concerning for angina equivalent HTN Aortic Atherosclerosis & HLD (hypertryglyceridemia) PVCs and PACs - Continue ASA 81 mg QD, current rosuvastatin 10 mg  *** - previously unable to offer CCTA in the setting of the international contrast crisis - continue norvasc 2.5 mg, HCTZ 25 mg controlled with PC MD  regimen   Medication Adjustments/Labs and Tests Ordered: Current medicines are reviewed at length with the patient today.  Concerns regarding medicines are outlined above.  No orders of the defined types were placed in this encounter.  No orders of the defined types were placed in this encounter.   There are no Patient Instructions on file for this visit.   Signed, Werner Lean, MD  08/06/2021 3:09 PM    [place]

## 2021-08-07 ENCOUNTER — Ambulatory Visit: Payer: Medicare Other | Admitting: Internal Medicine

## 2022-02-15 ENCOUNTER — Encounter: Payer: Self-pay | Admitting: Gastroenterology

## 2022-04-03 ENCOUNTER — Encounter: Payer: Self-pay | Admitting: Gastroenterology

## 2022-04-03 ENCOUNTER — Other Ambulatory Visit: Payer: Medicare Other

## 2022-04-03 ENCOUNTER — Ambulatory Visit: Payer: Medicare Other | Admitting: Gastroenterology

## 2022-04-03 VITALS — BP 122/78 | HR 73 | Ht 70.0 in | Wt 194.8 lb

## 2022-04-03 DIAGNOSIS — K760 Fatty (change of) liver, not elsewhere classified: Secondary | ICD-10-CM

## 2022-04-03 DIAGNOSIS — K52831 Collagenous colitis: Secondary | ICD-10-CM

## 2022-04-03 DIAGNOSIS — R197 Diarrhea, unspecified: Secondary | ICD-10-CM | POA: Diagnosis not present

## 2022-04-03 MED ORDER — BUDESONIDE 3 MG PO CPEP: 9.0000 mg | ORAL_CAPSULE | Freq: Every day | ORAL | 0 refills | Status: DC

## 2022-04-03 NOTE — Progress Notes (Signed)
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 R/O infectious causes Personal history of adenomatous colon polyps - last colonoscopy no polyps GERD, diet controlled Hepatic steatosis   Recommendations   Budesonide 9 mg daily for 3 months then gradual taper GI stool profile Discontinue surveillance colonoscopies due to absence of polyps on last colonoscopy and age Standard recommendations for hepatic steatosis including long-term fat modified, carb modified weight loss diet to achieve a normal BMI followed by his PCP. REV in 2 months    HPI   Chief complaint: Diarrhea  Patient profile:  Lee Compton is a 81 y.o. male referred by Shon Baton, MD for diarrhea.  He was evaluated in 2019 for diarrhea.  Colonoscopy was performed showing only internal hemorrhoids.  Random biopsies revealed collagenous colitis and he was placed on budesonide.  Lactose intolerance may have been contributing.  After completing a course of budesonide his diarrhea resolved and he has not returned for GI follow-up.  He has been treated with budesonide a few times by Dr. Virgina Jock over the past few years.  He had a flare of symptoms in April and was treated again with budesonide with only partial control of symptoms.  He is no longer taking budesonide and his diarrhea has worsened.  He has multiple loose stools during the day and 3-4 overnight.  Denies weight loss, abdominal pain, constipation, change in stool caliber, melena, hematochezia, nausea, vomiting, dysphagia, reflux symptoms, chest pain.    Previous Labs / Imaging::    Latest Ref Rng & Units 04/27/2017    2:21 PM 04/27/2017    1:55 PM  CBC  WBC 4.0 - 10.5 K/uL  7.9   Hemoglobin 13.0 - 17.0 g/dL 12.9  13.3   Hematocrit 39.0 - 52.0 % 38.0  38.0   Platelets 150 - 400 K/uL  258     No results found for: "LIPASE"    Latest Ref Rng & Units 07/05/2020   12:16 PM 04/27/2017    2:21 PM 04/27/2017    1:55 PM  CMP  Glucose 65 - 99 mg/dL 107  109  114   BUN  8 - 27 mg/dL '17  18  15   '$ Creatinine 0.76 - 1.27 mg/dL 0.95  0.80  0.88   Sodium 134 - 144 mmol/L 141  140  139   Potassium 3.5 - 5.2 mmol/L 4.3  3.3  3.3   Chloride 96 - 106 mmol/L 101  102  104   CO2 20 - 29 mmol/L 24   25   Calcium 8.6 - 10.2 mg/dL 9.9   9.0   Total Protein 6.5 - 8.1 g/dL   6.2   Total Bilirubin 0.3 - 1.2 mg/dL   0.6   Alkaline Phos 38 - 126 U/L   40   AST 15 - 41 U/L   24   ALT 17 - 63 U/L   21      Previous GI evaluation    Endoscopies:  Colonoscopy June 2019 - The examined portion of the ileum was normal. - Internal hemorrhoids. - The examination was otherwise normal on direct and retroflexion views. Random biopsies obtained. Path: Collagenous colitis  Imaging:  MYOCARDIAL PERFUSION IMAGING  The left ventricular ejection fraction is mildly decreased (45-54%).  Nuclear stress EF: 49%.  There was no ST segment deviation noted during stress.  No T wave inversion was noted during stress.  The study is normal.  This is a low risk study.  1.  Reduced counts in the apex with normal wall motion consistent with  apical thinning artifact. 2.  Normal study without evidence of ischemia or infarction. 3.  Normal LVEF for this modality, 49%. 4.  This is a low risk study.    Past Medical History:  Diagnosis Date   Abdominal pain    Allergy    Anxiety    Arrhythmia    Asthma    Atherosclerosis of aorta (HCC)    Cataract    bilateral cateracts   Collagenous colitis    Constipation    Cough    Depression    Diarrhea    Disorder of adrenal gland (HCC)    Dyspnea on exertion    Edema    Effusion of left knee joint    Fatty liver    Gastro-esophageal reflux disease without esophagitis    GERD (gastroesophageal reflux disease)    Gout    Herpes zoster    Hypercholesterolemia    Hyperkalemia    Hypertension    Impaired fasting glucose    Impingement syndrome of right shoulder    Insomnia    Male erectile dysfunction, unspecified     Nocturia    Obesity    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    Other specified abnormal findings of blood chemistry    Other specified disorders of bone density and structure, unspecified site    Pain in left knee    Pain in right ankle and joints of right foot    Peripheral vascular disease, unspecified (HCC)    Rash and other nonspecific skin eruption    Shingles    Skin infection    SOB (shortness of breath)    Tinnitus    Unspecified fracture of unspecified wrist and hand, sequela    Unspecified osteoarthritis, unspecified site    Ventricular premature depolarization    Past Surgical History:  Procedure Laterality Date   BACK SURGERY  1973   CATARACT EXTRACTION     COLONOSCOPY     LUMBAR LAMINECTOMY     NASAL SINUS SURGERY  1980's, 1998   OPEN REDUCTION INTERNAL FIXATION (ORIF) DISTAL RADIAL FRACTURE Left 04/27/2017   Procedure: IRRIGATION AND DEBRIDEMENT WITH OPEN REDUCTION INTERNAL FIXATION (ORIF) DISTAL RADIAL FRACTURE;  Surgeon: Roseanne Kaufman, MD;  Location: [place];  Service: Orthopedics;  Laterality: Left;   ROTATOR CUFF REPAIR     right shoulder   sinus surgeries     Family History  Problem Relation Age of Onset   Alzheimer's disease Mother    Heart disease Father    CVA Father    Pancreatic cancer Brother    Diabetes Mellitus II Daughter    Heart disease Brother    Colon cancer Neg Hx    Stomach cancer Neg Hx    Esophageal cancer Neg Hx    Prostate cancer Neg Hx    Rectal cancer Neg Hx    Liver cancer Neg Hx    Social History   Tobacco Use   Smoking status: Former    Types: Cigarettes    Quit date: 07/27/1969    Years since quitting: 52.7   Smokeless tobacco: Former    Types: Chew   Tobacco comments:    many years that pt quit chew  Vaping Use   Vaping Use: Never used  Substance Use Topics   Alcohol use: Yes    Alcohol/week: 3.0 - 4.0 standard drinks of alcohol    Types: 3 - 4  Cans of beer per week   Drug use: No   Current  Outpatient Medications  Medication Sig Dispense Refill   amLODipine (NORVASC) 2.5 MG tablet Take 2.5 mg by mouth daily.     aspirin EC 81 MG tablet Take 81 mg by mouth daily.     colchicine 0.6 MG tablet Take 0.3 mg by mouth daily.     DULoxetine (CYMBALTA) 60 MG capsule Take 60 mg by mouth daily.     Fluticasone-Salmeterol (ADVAIR) 100-50 MCG/DOSE AEPB Inhale 1 puff into the lungs daily.     hydrochlorothiazide (HYDRODIURIL) 25 MG tablet Take 25 mg by mouth daily.     losartan (COZAAR) 100 MG tablet Take 100 mg by mouth daily.     rosuvastatin (CRESTOR) 10 MG tablet Take 10 mg by mouth daily.     No current facility-administered medications for this visit.   No Known Allergies  Review of Systems: All other systems reviewed and negative except where noted in HPI.    Physical Exam    Wt Readings from Last 3 Encounters:  04/03/22 194 lb 12.8 oz (88.4 kg)  01/30/21 196 lb (88.9 kg)  01/12/21 196 lb (88.9 kg)    Ht '5\' 10"'$  (1.778 m)   Wt 194 lb 12.8 oz (88.4 kg)   BMI 27.95 kg/m  Constitutional:  Generally well appearing male in no acute distress. Psychiatric: Pleasant. Normal mood and affect. Behavior is normal. HEENT: Pupils normal.  Conjunctivae are normal. No scleral icterus. Neck supple.  Cardiovascular: Normal rate, regular rhythm. No edema Pulmonary/chest: Effort normal and breath sounds normal. No wheezing, rales or rhonchi. Abdominal: Soft, nondistended, nontender. Bowel sounds active throughout. There are no masses palpable. No hepatomegaly. Rectal: Not done Neurological: Alert and oriented to person place and time. Skin: Skin is warm and dry. No rashes noted.  Lucio Edward, MD   cc:  Referring Provider Shon Baton, MD

## 2022-04-03 NOTE — Patient Instructions (Addendum)
_______________________________________________________  If you are age 79 or older, your body mass index should be between 23-30. Your Body mass index is 27.95 kg/m. If this is out of the aforementioned range listed, please consider follow up with your Primary Care Provider.  If you are age 24 or younger, your body mass index should be between 19-25. Your Body mass index is 27.95 kg/m. If this is out of the aformentioned range listed, please consider follow up with your Primary Care Provider.   ________________________________________________________  The [facility] providers would like to encourage you to use [employer] to communicate with providers for non-urgent requests or questions.  Due to long hold times on the telephone, sending your provider a message by [facility] may be a faster and more efficient way to get a response.  Please allow 48 business hours for a response.  Please remember that this is for non-urgent requests.  _______________________________________________________   Your provider has requested that you go to the basement level for lab work before leaving today. Press "B" on the elevator. The lab is located at the first door on the left as you exit the elevator.   We have sent the following medications to your pharmacy for you to pick up at your convenience: Budesonide   We have scheduled your follow up appointment with Dr.Stark for October 11 at 9:10 .  Due to recent changes in healthcare laws, you may see the results of your imaging and laboratory studies on MyChart before your provider has had a chance to review them.  We understand that in some cases there may be results that are confusing or concerning to you. Not all laboratory results come back in the same time frame and the provider may be waiting for multiple results in order to interpret others.  Please give [place] 48 hours in order for your provider to thoroughly review all the results before contacting the office for  clarification of your results.    It was a pleasure to see you today!  Thank you for trusting me with your gastrointestinal care!

## 2022-04-04 ENCOUNTER — Other Ambulatory Visit: Payer: Medicare Other

## 2022-04-04 DIAGNOSIS — R197 Diarrhea, unspecified: Secondary | ICD-10-CM

## 2022-04-04 DIAGNOSIS — K52831 Collagenous colitis: Secondary | ICD-10-CM

## 2022-04-05 LAB — GI PROFILE, STOOL, PCR

## 2022-06-06 ENCOUNTER — Encounter: Payer: Self-pay | Admitting: Gastroenterology

## 2022-06-06 ENCOUNTER — Ambulatory Visit (INDEPENDENT_AMBULATORY_CARE_PROVIDER_SITE_OTHER): Payer: Medicare Other | Admitting: Gastroenterology

## 2022-06-06 VITALS — BP 120/72 | HR 108 | Ht 70.0 in | Wt 197.0 lb

## 2022-06-06 DIAGNOSIS — K760 Fatty (change of) liver, not elsewhere classified: Secondary | ICD-10-CM | POA: Diagnosis not present

## 2022-06-06 DIAGNOSIS — K52831 Collagenous colitis: Secondary | ICD-10-CM | POA: Diagnosis not present

## 2022-06-06 NOTE — Patient Instructions (Signed)
Continue budesonide 9 mg daily one more month then reduce to 6 mg (2 capsules) daily x 2 weeks, then reduce to 3 mg (1 capsule) daily x 2 weeks, then reduce to 3 mg (1 capsule) every other day x 2 weeks, then stop.  Call or MyChart message our office if your diarrhea returns after taper.   The [place] [facility] providers would like to encourage you to use [facility] to communicate with providers for non-urgent requests or questions.  Due to long hold times on the telephone, sending your provider a message by [facility] may be a faster and more efficient way to get a response.  Please allow 48 business hours for a response.  Please remember that this is for non-urgent requests.   Thank you for choosing me and [facility].  Pricilla Riffle. Dagoberto Ligas., MD., Marval Regal

## 2022-06-06 NOTE — Progress Notes (Signed)
     Assessment     Collagenous colitis, diarrhea controlled on budesonide Personal history of adenomatous colon polyps, no longer in surveillance due to age GERD, diet-controlled Hepatic steatosis   Recommendations    Complete 3 months of budesonide 9 mg daily, then taper with 6 mg for 2 weeks, 3 mg for 2 weeks and 3 mg every other day for 2 weeks If budesonide does not provide a good long-term result or is too expensive to obtain we can try Imodium as a long-term option Long-term carb modified, fat modified weight loss diet followed by his PCP REV in 3 months   HPI    This is a 81 year old male with collagenous colitis and diarrhea returning for follow-up.  Budesonide 9 mg daily was started in early August with plans for 58-monthcourse and then gradual taper.  His diarrhea resolved on budesonide.  He states his recent prescription ran out a few days ago and his stools have become looser and less formed.  He relates that his cost of budesonide is $100 per month.   Labs / Imaging    none  Current Medications, Allergies, Past Medical History, Past Surgical History, Family History and Social History were reviewed in [employer].   Physical Exam: General: Well developed, well nourished, no acute distress Head: Normocephalic and atraumatic Eyes: Sclerae anicteric, EOMI Ears: Normal auditory acuity Mouth: Not examined Lungs: Clear throughout to auscultation Heart: Regular rate and rhythm; no murmurs, rubs or bruits Abdomen: Soft, non tender and non distended. No masses, hepatosplenomegaly or hernias noted. Normal Bowel sounds Rectal: Not done Musculoskeletal: Symmetrical with no gross deformities  Pulses:  Normal pulses noted Extremities: No clubbing, cyanosis, edema or deformities noted Neurological: Alert oriented x 4, grossly nonfocal Psychological:  Alert and cooperative. Normal mood and affect   Jaquita Bessire T. SFuller Plan MD 06/06/2022, 9:24 AM

## 2022-07-11 ENCOUNTER — Other Ambulatory Visit: Payer: Self-pay | Admitting: Gastroenterology

## 2022-07-11 NOTE — Telephone Encounter (Signed)
Patient called states he needs refill on his Budesonide 3 mg. Requesting a call back as soon as possible. Please call to advise.

## 2022-07-11 NOTE — Telephone Encounter (Signed)
Patient is requesting budesonide. Please advise.

## 2022-07-12 MED ORDER — BUDESONIDE 3 MG PO CPEP: 9.0000 mg | ORAL_CAPSULE | Freq: Every day | ORAL | 0 refills | Status: DC

## 2022-07-12 NOTE — Telephone Encounter (Signed)
Per last office note from Dr. Fuller Plan: Patient is to taper budesonide after taking 9 mg daily x 3 months. Prescription sent to patient's pharmacy.

## 2022-10-18 ENCOUNTER — Telehealth: Payer: Self-pay | Admitting: Gastroenterology

## 2022-10-18 NOTE — Telephone Encounter (Signed)
The pt has continued issue with bowel changes and would like to speak with Dr Fuller Plan in regards to medication changes.  He was last seen in October and was advised to f/u in 3 months.  Appt made to see Dr Fuller Plan on 2/26 at 1010 am

## 2022-10-18 NOTE — Telephone Encounter (Signed)
Patient called states he is still having issues with going to the bathroom, also said he was given Budesonide medication not sure if it should be changed or what else he can do.

## 2022-10-22 ENCOUNTER — Ambulatory Visit: Payer: Medicare Other | Admitting: Gastroenterology

## 2022-10-23 ENCOUNTER — Ambulatory Visit: Payer: Medicare Other | Admitting: Gastroenterology

## 2022-10-23 ENCOUNTER — Encounter: Payer: Self-pay | Admitting: Gastroenterology

## 2022-10-23 VITALS — BP 122/80 | HR 76 | Ht 70.0 in | Wt 194.0 lb

## 2022-10-23 DIAGNOSIS — K52831 Collagenous colitis: Secondary | ICD-10-CM

## 2022-10-23 MED ORDER — BUDESONIDE 3 MG PO CPEP: 9.0000 mg | ORAL_CAPSULE | Freq: Every day | ORAL | 3 refills | Status: AC

## 2022-10-23 NOTE — Patient Instructions (Addendum)
We have sent the following medications to your pharmacy for you to pick up at your convenience: budesonide 9 mg (3 capsules) by mouth daily x 6 weeks, then reduce to 6 mg (2 capsules) by mouth daily x 2 weeks, then reduce to 3 mg (1 capsule) by mouth daily long-term.  You can also take over the counter Imodium 1-2 three times a day as needed for diarrhea.   Please follow up with Dr. Fuller Plan in 3 months but if your symptoms get worse, contact our office.  The [facility] providers would like to encourage you to use [facility] to communicate with providers for non-urgent requests or questions.  Due to long hold times on the telephone, sending your provider a message by [facility] may be a faster and more efficient way to get a response.  Please allow 48 business hours for a response.  Please remember that this is for non-urgent requests.   Thank you for choosing me and [facility].  Pricilla Riffle. Dagoberto Ligas., MD., Marval Regal

## 2022-10-23 NOTE — Progress Notes (Signed)
    Assessment     Collagenous colitis with active symptoms Personal history of adenomatous colon polyps, no longer in surveillance GERD, diet controlled Hepatic steatosis   Recommendations    Resume budesonide 9 mg daily for 6 weeks, then 6 mg daily for 2 weeks, then 3 mg daily until follow-up. Begin Imodium 1-2 p.o. 3 times daily as needed REV in 3 months   HPI    [name] returning for follow-up of collagenous colitis.  He relates very good symptom control on budesonide 9 mg daily and 6 mg daily.  When he tapered to 3 mg daily he noted a slight increase in diarrhea but felt the symptoms were under decent control.  After discontinuing budesonide completely his diarrhea has substantially worsened having 4-8 loose urgent loose liquid bowel movements daily, primarily in the mornings.  His appetite is good and his weight is stable.   Labs / Imaging       Latest Ref Rng & Units 04/27/2017    1:55 PM  Hepatic Function  Total Protein 6.5 - 8.1 g/dL 6.2   Albumin 3.5 - 5.0 g/dL 3.7   AST 15 - 41 U/L 24   ALT 17 - 63 U/L 21   Alk Phosphatase 38 - 126 U/L 40   Total Bilirubin 0.3 - 1.2 mg/dL 0.6        Latest Ref Rng & Units 04/27/2017    2:21 PM 04/27/2017    1:55 PM  CBC  WBC 4.0 - 10.5 K/uL  7.9   Hemoglobin 13.0 - 17.0 g/dL 12.9  13.3   Hematocrit 39.0 - 52.0 % 38.0  38.0   Platelets 150 - 400 K/uL  258    Current Medications, Allergies, Past Medical History, Past Surgical History, Family History and Social History were reviewed in [employer] record.   Physical Exam: General: Well developed, well nourished, no acute distress Head: Normocephalic and atraumatic Eyes: Sclerae anicteric, EOMI Ears: Normal auditory acuity Mouth: No deformities or lesions noted Lungs: Clear throughout to auscultation Heart: Regular rate and rhythm; No murmurs, rubs or bruits Abdomen: Soft, non tender and non distended. No masses, hepatosplenomegaly or  hernias noted. Normal Bowel sounds Rectal: Not done Musculoskeletal: Symmetrical with no gross deformities  Pulses:  Normal pulses noted Extremities: No edema or deformities noted Neurological: Alert oriented x 4, grossly nonfocal Psychological:  Alert and cooperative. Normal mood and affect   Brnadon Eoff T. Fuller Plan, MD 10/23/2022, 9:06 AM

## 2023-01-23 ENCOUNTER — Encounter: Payer: Self-pay | Admitting: Gastroenterology

## 2023-01-23 ENCOUNTER — Ambulatory Visit: Payer: Medicare Other | Admitting: Gastroenterology

## 2023-01-23 VITALS — BP 136/70 | HR 68 | Ht 70.0 in | Wt 192.0 lb

## 2023-01-23 DIAGNOSIS — K52831 Collagenous colitis: Secondary | ICD-10-CM | POA: Diagnosis not present

## 2023-01-23 NOTE — Progress Notes (Signed)
    Assessment     Collagenous colitis, symptoms controlled Personal history of adenomatous colon polyps, no longer in surveillance GERD, diet controlled Hepatic steatosis   Recommendations    Complete 2 weeks of budesonide 3 mg and then discontinue  Continue Imodium 1-2 tid prn Contact us if diarrhea recurs REV in 1 year   HPI    [name] with collagenous colitis.  His diarrhea has substantially improved on budesonide.  He is currently having 2-3 semi-formed to looser stools per day and taking Imodium qd 2 or 3 times per week.  He has just tapered budesonide to 3 mg daily.   Labs / Imaging       Latest Ref Rng & Units 04/27/2017    1:55 PM  Hepatic Function  Total Protein 6.5 - 8.1 g/dL 6.2   Albumin 3.5 - 5.0 g/dL 3.7   AST 15 - 41 U/L 24   ALT 17 - 63 U/L 21   Alk Phosphatase 38 - 126 U/L 40   Total Bilirubin 0.3 - 1.2 mg/dL 0.6        Latest Ref Rng & Units 04/27/2017    2:21 PM 04/27/2017    1:55 PM  CBC  WBC 4.0 - 10.5 K/uL  7.9   Hemoglobin 13.0 - 17.0 g/dL 09.8  11.9   Hematocrit 39.0 - 52.0 % 38.0  38.0   Platelets 150 - 400 K/uL  258    Current Medications, Allergies, Past Medical History, Past Surgical History, Family History and Social History were reviewed in [employer] record.   Physical Exam: General: Well developed, well nourished, no acute distress Head: Normocephalic and atraumatic Eyes: Sclerae anicteric, EOMI Ears: Normal auditory acuity Mouth: No deformities or lesions noted Lungs: Clear throughout to auscultation Heart: Regular rate and rhythm; No murmurs, rubs or bruits Abdomen: Soft, non tender and non distended. No masses, hepatosplenomegaly or hernias noted. Normal Bowel sounds Rectal: Not done Musculoskeletal: Symmetrical with no gross deformities  Pulses:  Normal pulses noted Extremities: No edema or deformities noted Neurological: Alert oriented x 4, grossly nonfocal Psychological:  Alert  and cooperative. Normal mood and affect   Lee Compton T. Russella Dar, MD 01/23/2023, 1:19 PM

## 2023-01-23 NOTE — Patient Instructions (Signed)
Finish budesonide taper taking 3 mg daily x 2 weeks.   Call our office if your diarrhea symptoms return.   The [facility] providers would like to encourage you to use [facility] to communicate with providers for non-urgent requests or questions.  Due to long hold times on the telephone, sending your provider a message by [facility] may be a faster and more efficient way to get a response.  Please allow 48 business hours for a response.  Please remember that this is for non-urgent requests.   Thank you for choosing me and [facility].  Venita Lick. Pleas Koch., MD., Clementeen Graham
